# Patient Record
Sex: Male | Born: 1955 | Race: White | Hispanic: No | Smoking: Never smoker
Health system: Southern US, Community
[De-identification: ages and names within clinical notes are randomized; demographics above are authoritative.]

## PROBLEM LIST (undated history)

## (undated) DIAGNOSIS — E785 Hyperlipidemia, unspecified: Secondary | ICD-10-CM

## (undated) DIAGNOSIS — I10 Essential (primary) hypertension: Secondary | ICD-10-CM

## (undated) DIAGNOSIS — N200 Calculus of kidney: Secondary | ICD-10-CM

## (undated) DIAGNOSIS — N201 Calculus of ureter: Secondary | ICD-10-CM

## (undated) DIAGNOSIS — G4730 Sleep apnea, unspecified: Secondary | ICD-10-CM

## (undated) DIAGNOSIS — E119 Type 2 diabetes mellitus without complications: Secondary | ICD-10-CM

## (undated) DIAGNOSIS — I480 Paroxysmal atrial fibrillation: Secondary | ICD-10-CM

## (undated) DIAGNOSIS — M1990 Unspecified osteoarthritis, unspecified site: Secondary | ICD-10-CM

## (undated) DIAGNOSIS — T8859XA Other complications of anesthesia, initial encounter: Secondary | ICD-10-CM

## (undated) DIAGNOSIS — Z87442 Personal history of urinary calculi: Secondary | ICD-10-CM

## (undated) DIAGNOSIS — R112 Nausea with vomiting, unspecified: Secondary | ICD-10-CM

## (undated) DIAGNOSIS — Z7982 Long term (current) use of aspirin: Secondary | ICD-10-CM

## (undated) DIAGNOSIS — I5189 Other ill-defined heart diseases: Secondary | ICD-10-CM

## (undated) DIAGNOSIS — R079 Chest pain, unspecified: Secondary | ICD-10-CM

## (undated) DIAGNOSIS — G4733 Obstructive sleep apnea (adult) (pediatric): Secondary | ICD-10-CM

## (undated) HISTORY — PX: LITHOTRIPSY: SUR834

## (undated) HISTORY — DX: Morbid (severe) obesity due to excess calories: E66.01

## (undated) HISTORY — DX: Hyperlipidemia, unspecified: E78.5

## (undated) HISTORY — DX: Chest pain, unspecified: R07.9

## (undated) HISTORY — PX: COLONOSCOPY: SHX174

## (undated) HISTORY — DX: Other ill-defined heart diseases: I51.89

## (undated) HISTORY — DX: Type 2 diabetes mellitus without complications: E11.9

## (undated) HISTORY — DX: Calculus of kidney: N20.0

## (undated) HISTORY — DX: Paroxysmal atrial fibrillation: I48.0

---

## 2004-03-07 ENCOUNTER — Ambulatory Visit: Payer: Self-pay | Admitting: Urology

## 2004-03-24 ENCOUNTER — Ambulatory Visit: Payer: Self-pay | Admitting: Urology

## 2004-03-26 ENCOUNTER — Emergency Department: Payer: Self-pay | Admitting: Emergency Medicine

## 2004-10-28 ENCOUNTER — Ambulatory Visit: Payer: Self-pay

## 2006-09-13 ENCOUNTER — Emergency Department: Payer: Self-pay | Admitting: Emergency Medicine

## 2006-09-13 ENCOUNTER — Other Ambulatory Visit: Payer: Self-pay

## 2009-09-30 ENCOUNTER — Ambulatory Visit: Payer: Self-pay | Admitting: Urology

## 2011-02-10 ENCOUNTER — Ambulatory Visit: Payer: Self-pay | Admitting: Family Medicine

## 2011-04-25 DIAGNOSIS — N529 Male erectile dysfunction, unspecified: Secondary | ICD-10-CM

## 2011-04-25 DIAGNOSIS — E291 Testicular hypofunction: Secondary | ICD-10-CM

## 2011-04-25 HISTORY — DX: Testicular hypofunction: E29.1

## 2011-04-25 HISTORY — DX: Male erectile dysfunction, unspecified: N52.9

## 2011-08-16 ENCOUNTER — Ambulatory Visit: Payer: Self-pay | Admitting: Urology

## 2011-12-10 ENCOUNTER — Ambulatory Visit: Payer: Self-pay | Admitting: Medical

## 2012-01-24 ENCOUNTER — Ambulatory Visit: Payer: Self-pay | Admitting: Urology

## 2012-01-24 DIAGNOSIS — E291 Testicular hypofunction: Secondary | ICD-10-CM | POA: Insufficient documentation

## 2012-01-24 DIAGNOSIS — N529 Male erectile dysfunction, unspecified: Secondary | ICD-10-CM | POA: Insufficient documentation

## 2012-01-24 DIAGNOSIS — N201 Calculus of ureter: Secondary | ICD-10-CM | POA: Insufficient documentation

## 2012-01-24 DIAGNOSIS — N23 Unspecified renal colic: Secondary | ICD-10-CM | POA: Insufficient documentation

## 2012-01-24 DIAGNOSIS — N200 Calculus of kidney: Secondary | ICD-10-CM | POA: Insufficient documentation

## 2012-04-10 DIAGNOSIS — R8290 Unspecified abnormal findings in urine: Secondary | ICD-10-CM | POA: Insufficient documentation

## 2012-07-10 ENCOUNTER — Ambulatory Visit: Payer: Self-pay | Admitting: Urology

## 2012-09-05 ENCOUNTER — Ambulatory Visit: Payer: Self-pay | Admitting: Unknown Physician Specialty

## 2013-02-18 ENCOUNTER — Ambulatory Visit: Payer: Self-pay | Admitting: Urology

## 2013-02-21 DIAGNOSIS — R310 Gross hematuria: Secondary | ICD-10-CM | POA: Insufficient documentation

## 2013-02-25 ENCOUNTER — Ambulatory Visit: Payer: Self-pay | Admitting: Urology

## 2013-06-06 ENCOUNTER — Ambulatory Visit: Payer: Self-pay | Admitting: Family Medicine

## 2013-06-16 ENCOUNTER — Ambulatory Visit: Payer: Self-pay

## 2013-08-22 DIAGNOSIS — I480 Paroxysmal atrial fibrillation: Secondary | ICD-10-CM

## 2013-08-22 HISTORY — DX: Paroxysmal atrial fibrillation: I48.0

## 2013-09-05 ENCOUNTER — Observation Stay: Payer: Self-pay | Admitting: Family Medicine

## 2013-09-05 DIAGNOSIS — I4891 Unspecified atrial fibrillation: Secondary | ICD-10-CM

## 2013-09-05 DIAGNOSIS — R072 Precordial pain: Secondary | ICD-10-CM

## 2013-09-05 LAB — COMPREHENSIVE METABOLIC PANEL
ALK PHOS: 74 U/L
ANION GAP: 1 — AB (ref 7–16)
AST: 19 U/L (ref 15–37)
Albumin: 3.7 g/dL (ref 3.4–5.0)
BUN: 12 mg/dL (ref 7–18)
Bilirubin,Total: 0.7 mg/dL (ref 0.2–1.0)
CALCIUM: 8.7 mg/dL (ref 8.5–10.1)
CO2: 31 mmol/L (ref 21–32)
CREATININE: 0.67 mg/dL (ref 0.60–1.30)
Chloride: 108 mmol/L — ABNORMAL HIGH (ref 98–107)
EGFR (Non-African Amer.): >60
GLUCOSE: 103 mg/dL — AB (ref 65–99)
OSMOLALITY: 279 (ref 275–301)
POTASSIUM: 3.9 mmol/L (ref 3.5–5.1)
SGPT (ALT): 27 U/L (ref 12–78)
Sodium: 140 mmol/L (ref 136–145)
Total Protein: 7.3 g/dL (ref 6.4–8.2)

## 2013-09-05 LAB — CBC
HCT: 43.7 % (ref 40.0–52.0)
HGB: 15.1 g/dL (ref 13.0–18.0)
MCH: 32 pg (ref 26.0–34.0)
MCHC: 34.6 g/dL (ref 32.0–36.0)
MCV: 93 fL (ref 80–100)
Platelet: 209 10*3/uL (ref 150–440)
RBC: 4.72 10*6/uL (ref 4.40–5.90)
RDW: 13.2 % (ref 11.5–14.5)
WBC: 6.7 10*3/uL (ref 3.8–10.6)

## 2013-09-05 LAB — TROPONIN I: Troponin-I: <0.02 ng/mL

## 2013-09-05 LAB — CK TOTAL AND CKMB (NOT AT ARMC)
CK, TOTAL: 95 U/L
CK-MB: 1.1 ng/mL (ref 0.5–3.6)

## 2013-09-05 LAB — MAGNESIUM: MAGNESIUM: 2 mg/dL

## 2013-09-06 DIAGNOSIS — I517 Cardiomegaly: Secondary | ICD-10-CM

## 2013-09-06 LAB — BASIC METABOLIC PANEL
Anion Gap: 4 — ABNORMAL LOW (ref 7–16)
BUN: 15 mg/dL (ref 7–18)
CALCIUM: 8.7 mg/dL (ref 8.5–10.1)
Chloride: 109 mmol/L — ABNORMAL HIGH (ref 98–107)
Co2: 29 mmol/L (ref 21–32)
Creatinine: 0.62 mg/dL (ref 0.60–1.30)
EGFR (African American): >60
GLUCOSE: 108 mg/dL — AB (ref 65–99)
Osmolality: 284 (ref 275–301)
Potassium: 4.3 mmol/L (ref 3.5–5.1)
Sodium: 142 mmol/L (ref 136–145)

## 2013-09-06 LAB — CBC WITH DIFFERENTIAL/PLATELET
BASOS ABS: 0 10*3/uL (ref 0.0–0.1)
Basophil %: 0.5 %
EOS ABS: 0.1 10*3/uL (ref 0.0–0.7)
Eosinophil %: 1.8 %
HCT: 42.6 % (ref 40.0–52.0)
HGB: 14.8 g/dL (ref 13.0–18.0)
LYMPHS ABS: 1.7 10*3/uL (ref 1.0–3.6)
LYMPHS PCT: 24.2 %
MCH: 32.1 pg (ref 26.0–34.0)
MCHC: 34.7 g/dL (ref 32.0–36.0)
MCV: 93 fL (ref 80–100)
MONOS PCT: 7.9 %
Monocyte #: 0.6 x10 3/mm (ref 0.2–1.0)
NEUTROS PCT: 65.6 %
Neutrophil #: 4.7 10*3/uL (ref 1.4–6.5)
Platelet: 210 10*3/uL (ref 150–440)
RBC: 4.59 10*6/uL (ref 4.40–5.90)
RDW: 13.2 % (ref 11.5–14.5)
WBC: 7.2 10*3/uL (ref 3.8–10.6)

## 2013-09-06 LAB — LIPID PANEL
Cholesterol: 175 mg/dL (ref 0–200)
HDL Cholesterol: 29 mg/dL — ABNORMAL LOW (ref 40–60)
Ldl Cholesterol, Calc: 117 mg/dL — ABNORMAL HIGH (ref 0–100)
TRIGLYCERIDES: 145 mg/dL (ref 0–200)
VLDL CHOLESTEROL, CALC: 29 mg/dL (ref 5–40)

## 2013-09-06 LAB — TSH: Thyroid Stimulating Horm: 1.19 u[IU]/mL

## 2013-09-06 LAB — HEMOGLOBIN A1C: HEMOGLOBIN A1C: 5.3 % (ref 4.2–6.3)

## 2013-09-10 ENCOUNTER — Telehealth: Payer: Self-pay

## 2013-09-10 NOTE — Telephone Encounter (Signed)
Attempted to contact pt regarding discharge from [facility] on 09/06/13. Left message for pt to call back.

## 2013-09-11 NOTE — Telephone Encounter (Signed)
Patient contacted regarding discharge from [facility] on 09/06/13.  Patient understands to follow up with Dr. Mariah MillingGollan on 09/26/13 at 3:30 at [facility]. Patient understands discharge instructions? yes Patient understands medications and regiment? yes Patient understands to bring all medications to this visit? yes  Pt states that he has not picked up his metoprolol, as the prescription did not specify tartrate or succinate. Pt states that he does not want to take the metoprolol at all, as his friends told him to "stay away from it".  Pt adamantly refuses to take any new meds at this time, as he is afraid of the side effects.

## 2013-09-26 ENCOUNTER — Encounter: Payer: BC Managed Care – PPO | Admitting: Cardiovascular Disease

## 2013-09-26 ENCOUNTER — Encounter: Payer: Self-pay | Admitting: Cardiovascular Disease

## 2013-09-26 ENCOUNTER — Ambulatory Visit (INDEPENDENT_AMBULATORY_CARE_PROVIDER_SITE_OTHER): Payer: BC Managed Care – PPO | Admitting: Cardiovascular Disease

## 2013-09-26 ENCOUNTER — Encounter (INDEPENDENT_AMBULATORY_CARE_PROVIDER_SITE_OTHER): Payer: Self-pay

## 2013-09-26 VITALS — BP 130/80 | HR 62 | Ht 72.0 in | Wt 233.0 lb

## 2013-09-26 DIAGNOSIS — E785 Hyperlipidemia, unspecified: Secondary | ICD-10-CM

## 2013-09-26 DIAGNOSIS — E669 Obesity, unspecified: Secondary | ICD-10-CM

## 2013-09-26 DIAGNOSIS — R079 Chest pain, unspecified: Secondary | ICD-10-CM

## 2013-09-26 DIAGNOSIS — I4891 Unspecified atrial fibrillation: Secondary | ICD-10-CM

## 2013-09-26 DIAGNOSIS — G4733 Obstructive sleep apnea (adult) (pediatric): Secondary | ICD-10-CM

## 2013-09-26 MED ORDER — METOPROLOL TARTRATE 25 MG PO TABS: 25.0000 mg | ORAL_TABLET | Freq: Two times a day (BID) | ORAL | Status: DC | PRN

## 2013-09-26 NOTE — Assessment & Plan Note (Signed)
Recent chest pain in the setting of atrial fibrillation. Stress test showed no ischemia. Normal ejection fraction

## 2013-09-26 NOTE — Assessment & Plan Note (Signed)
We have encouraged continued exercise, careful diet management in an effort to lose weight. 

## 2013-09-26 NOTE — Assessment & Plan Note (Signed)
Encouraged him to stay on his Zocor. Encouraged weight loss

## 2013-09-26 NOTE — Progress Notes (Signed)
   Patient ID: Marcus Bryant, male    DOB: 1955-11-08, 58 y.o.   MRN: [id number]  HPI Comments: Marcus Bryant is a pleasant 58 year old gentleman with history of snoring, obstructive sleep apnea, positive sleep study per the patient and 2006 who does not wear CPAP, kidney stones, hyperlipidemia, abnormal stress test in 2008 who presented to the hospital 09/05/2013 with chest pain, atrial fibrillation. He converted to normal sinus rhythm en route, stress test showed no ischemia, echocardiogram showed normal ejection fraction with dilated left atrium He presents today for followup after recent discharge on 09/06/2013 Initial symptoms from his atrial fibrillation were described as a tightness in his chest. Initially presented to [facility] urgent care before being transferred by EMS to the emergency room  He was discharged on metoprolol but after reading the side effects, decided not to take the medication. He continues on simvastatin, aspirin, fish oil Since discharge he has had no symptoms of tachycardia or palpitations. He feels that his initial episode of atrial fibrillation was secondary to significant gastric discomfort after overeating including eating a bloomin onion. He tends to overeat at the buffet  Echocardiogram 09/06/2013 showed normal ejection fraction, normal right ventricular systolic pressure, moderately dilated left atrium  Stress test dated 09/06/2013 showed no ischemia, normal study, ejection fraction 70%  EKG today shows normal sinus rhythm with no significant ST or T wave changes, rate 62 beats per minute   Outpatient Encounter Prescriptions as of 09/26/2013  Medication Sig  . aspirin 81 MG tablet Take 81 mg by mouth daily.  . Omega-3 Fatty Acids (FISH OIL) 1000 MG CAPS Take 1,000 mg by mouth daily.  . simvastatin (ZOCOR) 40 MG tablet Take 40 mg by mouth daily.     Review of Systems  Constitutional: Negative.   HENT: Negative.   Eyes: Negative.   Respiratory: Negative.    Cardiovascular: Negative.   Gastrointestinal: Negative.   Endocrine: Negative.   Musculoskeletal: Negative.   Skin: Negative.   Allergic/Immunologic: Negative.   Neurological: Negative.   Hematological: Negative.   Psychiatric/Behavioral: Negative.   All other systems reviewed and are negative.   BP 130/80  Pulse 62  Ht 6' (1.829 m)  Wt 233 lb (105.688 kg)  BMI 31.59 kg/m2  Physical Exam  Nursing note and vitals reviewed. Constitutional: He is oriented to person, place, and time. He appears well-developed and well-nourished.  HENT:  Head: Normocephalic.  Nose: Nose normal.  Mouth/Throat: Oropharynx is clear and moist.  Eyes: Conjunctivae are normal. Pupils are equal, round, and reactive to light.  Neck: Normal range of motion. Neck supple. No JVD present.  Cardiovascular: Normal rate, regular rhythm, S1 normal, S2 normal, normal heart sounds and intact distal pulses.  Exam reveals no gallop and no friction rub.   No murmur heard. Pulmonary/Chest: Effort normal and breath sounds normal. No respiratory distress. He has no wheezes. He has no rales. He exhibits no tenderness.  Abdominal: Soft. Bowel sounds are normal. He exhibits no distension. There is no tenderness.  Musculoskeletal: Normal range of motion. He exhibits no edema and no tenderness.  Lymphadenopathy:    He has no cervical adenopathy.  Neurological: He is alert and oriented to person, place, and time. Coordination normal.  Skin: Skin is warm and dry. No rash noted. No erythema.  Psychiatric: He has a normal mood and affect. His behavior is normal. Judgment and thought content normal.      Assessment and Plan

## 2013-09-26 NOTE — Assessment & Plan Note (Signed)
History of obstructive sleep apnea, sleeps on his stomach, did not tolerate CPAP. This would be a risk factor for atrial fibrillation

## 2013-09-26 NOTE — Patient Instructions (Addendum)
You are doing well. No medication changes were made.  If you have additional episodes of atrial fibrillation, Take a metoprolol 1 to 2 pills  Please call us if you have new issues that need to be addressed before your next appt.  Your physician wants you to follow-up in: 6 months.  You will receive a reminder letter in the mail two months in advance. If you don't receive a letter, please call our office to schedule the follow-up appointment.

## 2013-09-26 NOTE — Assessment & Plan Note (Signed)
We spent most of his visit today talking about his recent episode of atrial fibrillation. Likely secondary to severe GI distress after overeating. Heart rate is borderline low. 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 We will hold off on starting routine beta blocker. We have given him metoprolol to take for any tachycardia or irregular rhythm. We discussed various ways for him to monitor his pulse rate at home including also oximeter, manual palpation and blood pressure cuff.

## 2013-10-01 ENCOUNTER — Encounter: Payer: Self-pay | Admitting: *Deleted

## 2013-10-01 ENCOUNTER — Telehealth: Payer: Self-pay

## 2013-10-01 NOTE — Telephone Encounter (Signed)
Letter faxed to number requested by patient  Patient informed

## 2013-10-01 NOTE — Telephone Encounter (Signed)
Pt called and states he needs a note for work when he was here on 09/26/2013. Pt states he needs this today. Please call. States call the work # and hit 2 and have him pages.

## 2013-10-22 ENCOUNTER — Telehealth: Payer: Self-pay | Admitting: Cardiovascular Disease

## 2013-10-22 NOTE — Telephone Encounter (Signed)
Pt got pulse meter per nurse and doctor,  But doesn't know what numbers mean.

## 2013-10-22 NOTE — Telephone Encounter (Signed)
Calling back told pt we would call back to them

## 2013-10-22 NOTE — Telephone Encounter (Signed)
Spoke w/ pt.  Advised him of how to use and read pulse/ox. He will call w/ further questions or concerns.

## 2013-10-22 NOTE — Telephone Encounter (Signed)
Left message for pt call back:  

## 2014-04-24 DIAGNOSIS — N138 Other obstructive and reflux uropathy: Secondary | ICD-10-CM

## 2014-04-24 HISTORY — DX: Benign prostatic hyperplasia with lower urinary tract symptoms: N13.8

## 2014-04-30 DIAGNOSIS — N138 Other obstructive and reflux uropathy: Secondary | ICD-10-CM | POA: Insufficient documentation

## 2014-04-30 DIAGNOSIS — N401 Enlarged prostate with lower urinary tract symptoms: Secondary | ICD-10-CM

## 2014-08-14 NOTE — Op Note (Signed)
PATIENT NAME:  Marcus Bryant, Marcus Bryant MR#:  [id number] DATE OF BIRTH:  02-23-1956  DATE OF PROCEDURE:  02/25/2013  PREOPERATIVE DIAGNOSIS: Bilateral ureterolithiasis, hydronephrosis, renal colic.   POSTOPERATIVE DIAGNOSES:  Bilateral ureterolithiasis, hydronephrosis, renal colic.   PROCEDURE: Bilateral ureteroscopy with holmium laser lithotripsy, double-J ureteral stent placement.   SURGEON: Edrick Oh, M.D.   ANESTHESIA: General endotracheal anesthesia.   INDICATIONS: The patient is a 59 year old gentleman with a long history of nephrolithiasis. He presented late last week with significant flank pain and discomfort. He had passed several stones spontaneously. A KUB was obtained, demonstrating a possible distal right ureteral calculus and possible mid left ureteral calculus. A subsequent CT scan was obtained, confirming presence of a 1.3 cm mid left ureteral calculus and a 6 to 8 mm distal right ureteral calculus with moderate obstruction. He had no evidence of renal insufficiency based on the stone passage. He presents for bilateral ureteroscopy for stone removal.   DESCRIPTION OF PROCEDURE: After informed consent was obtained, the patient was taken to the Operating Room and placed in the dorsal lithotomy position under general endotracheal anesthesia. The patient was then prepped and draped in the usual standard fashion. The 22-French rigid cystoscope was introduced into the urethra under direct vision with no urethral abnormalities noted. Upon entering the prostate fossa, moderate bilobar hypertrophy was noted with partial visual obstruction. Upon entering the bladder, the mucosa was inspected in its entirety with no gross mucosal lesions noted. Bilateral ureteral orifices were well visualized with no lesions noted. A flexible-tip Glidewire was introduced into the right ureteral orifice. Just inside of the orifice resistance was met. Multiple attempts were made at passing the guidewire under fluoroscopic  guidance without success.   The cystoscope was removed. The 6-French rigid ureteroscope was advanced into the urinary bladder. The guidewire was utilized to facilitate passage of the scope into the right ureteral orifice. The scope was advanced to the level of the stone. Significant inflammation and edema was present. With direct visualization and manipulation, the guidewire was able to be successfully passed into the upper pole collecting system. The holmium laser fiber was then utilized to fragment the stone. Visualization was limited due to the degree of the edema. The stone visualization was limited at multiple points through the fragmentation. With continued attempts and fragmentation of the edge of the stone, the stone was able to be subsequently pushed into a more dilated portion of the more proximal ureter. This facilitated better fragmentation of the remaining stone fragments. Once the stone was adequately fragmented, a basket was utilized to remove the bulk of the fragments. Multiple passes were required for removal. Once the right stone was addressed, the scope was advanced to the more proximal ureter with no additional stones noted. Prominent dilation was present. No other abnormalities were noted. Revisualization of the site of stone impaction demonstrated extensive edema. The decision was made to place a stent. The ureteroscope was removed. The 22-French rigid cystoscope was back-loaded over the guidewire and advanced into the urinary bladder. A 6-French x 26 cm double-J ureteral stent was advanced over the guidewire into the upper pole collecting system without difficulty. Adequate curl was noted within the renal pelvis. Adequate curl was also noted within the urinary bladder.   Attention was then turned to the left stone. The guidewire was advanced into the left ureteral orifice in the region of the mid pelvis at the level of the crossing vessels. Obstruction was once again met. The guidewire was  unable to be  passed beyond this level. The cystoscope was removed. The 6-French rigid ureteroscope was advanced into the urinary bladder. Utilizing the guidewire, it was advanced into the left ureter. It was advanced to the level of the mid ureter near the level of the crossing vessels. Extensive edema was encountered. The stone was barely visible through the edema. Several attempts were made at passing the guidewire beyond the stone. The initial attempt appeared to pass outside the course of the ureter. With repositioning, the guidewire was able to be advanced into the upper pole collecting system without further difficulty. In retrospect view, the degree of edema gave the appearance of the passage through the ureter. This was not performed based on subsequent visualization. With the guidewire in place, the holmium laser fiber was then utilized to fragment the stone into multiple smaller pieces. This was a larger stone and required much more fragmentation. The stone was noted to be very hard, requiring prolonged period of time for fragmentation. The remaining bulk of the stone was passed into a more dilated portion of the ureter, making fragmentation of the remaining pieces more easily accomplished. Once the stone was adequately fragmented, a basket was utilized to remove the bulk of the stone fragments. Once again, extensive edema was present at the site of stone impaction. The more proximal ureter demonstrated no other significant abnormalities.   The ureteroscope was removed. The 22-French rigid cystoscope was back-loaded over the guidewire and advanced into the urinary bladder. A second 6-French x 26 cm double-J ureteral stent was advanced over the guidewire into the upper pole collecting system without difficulty. Adequate curl was noted within the renal pelvis. Adequate curl was also noted within the urinary bladder. The bladder was irrigated free of remaining stone fragments. These were collected and will  be sent for stone analysis. The bladder was then drained.   The cystoscope was removed. The patient was returned to the supine position and awakened from general endotracheal anesthesia. He was taken to the Recovery Room in stable condition. There were no problems or complications. The patient tolerated the procedure well.   ____________________________ Denice Bors. Jacqlyn Larsen, MD bsc:cs D: 02/25/2013 20:02:48 ET T: 02/25/2013 20:16:47 ET JOB#: [id number]  cc: Denice Bors. Jacqlyn Larsen, MD, <Dictator> Denice Bors Jamien Casanova MD ELECTRONICALLY SIGNED 02/26/2013 7:59

## 2014-08-15 NOTE — H&P (Signed)
PATIENT NAME:  Marcus Bryant, Marcus Bryant MR#:  [id number] DATE OF BIRTH:  1955-09-10  DATE OF ADMISSION:  09/05/2013  REFERRING PHYSICIAN:  Governor Rooksebecca Lord.  PRIMARY CARE PHYSICIAN:  Barry BrunnerGlenn Willett.  CHIEF COMPLAINT:   Chest pain.  HISTORY OF PRESENT ILLNESS:  A very nice 59 year old gentleman with history of hyperlipidemia, kidney stones, [facility], had a hamburger, blooming onion, fries, and after that started developing some significant chest discomfort. Apparently, at 5:00 a.m. this morning, the patient woke up, stood up, went to the bathroom and started  sweating profusely. Chest pain initiated just right before that; felt like an uncomfortable sensation on the chest, located in the middle of the chest, diffuse, all over the anterior wall of the chest, dull, pressure-like, no shortness of breath associated, but the patient felt that he was breathing a little bit faster. Whenever he checked his pulse, his heart rate was also feeling really fast. The pain did not have any radiation, lasted for 1-1/2 to 2 hours and then went away. The patient laid down on the floor and he felt a little bit colder there because he was feeling really hot from the sweating, and the pain started to improve.   He called his girlfriend. His girlfriend took him to the urgent care. The urgent care was closed, but there was a GI office there, and the receptionist noted that the patient was keeping both arms on his chest. The patient denies having chest pain at that moment right now, but it seemed like he was having significant discomfort. He was sent to the Emergency Department. In the Emergency Department, he was evaluated. EMS evaluated him first at GI office, and they found out that he was in atrial fibrillation with a heart rate of up to 152. After he arrived to the Emergency Department, an EKG here showed normal sinus rhythm with a heart rate of 64.   The patient was not aware of having atrial fibrillation in  the past. He is admitted for evaluation of chest pain. He had a stress test in 2008 that was negative.   REVIEW OF SYSTEMS:  A twelve-system review of systems was done.  CONSTITUTIONAL:  No fever, fatigue, weakness, weight loss or weight gain.  EYES:  No blurry vision or double vision.  EARS, NOSE, THROAT:  No difficulty swallowing or tinnitus.  RESPIRATORY:  No shortness of breath, wheezing, painful respirations or hemoptysis.  CARDIOVASCULAR:  Positive chest pain. No orthopnea. No edema. (no previous history of arrhythmias. No palpitations. No syncope.  GASTROINTESTINAL:  No nausea, vomiting, abdominal pain, constipation or diarrhea.  GENITOURINARY:  No dysuria or hematuria.  ENDOCRINE:  No polyuria, polydipsia or polyphagia.  HEMATOLOGIC AND LYMPHATIC:  No anemia, easy bruising or bleeding.  SKIN:  No rashes or petechiae.   MUSCULOSKELETAL:  No significant neck pain, back pain, shoulder pain or gout.  NEUROLOGIC:  No numbness, tingling, vertigo or ataxia.  PSYCHIATRIC:  No insomnia or depression.   PAST MEDICAL HISTORY: 1.  Kidney stones.  2.  Hyperlipidemia.  3.  Mildly obese.   ALLERGIES:  No known drug allergies.   PAST SURGICAL HISTORY:  Seven kidney  stone lithotripsies.   FAMILY HISTORY:  Positive for MI in his father at the age of 59. He also had significant cancer. The patient is not aware of what type of cancer his father had. His mother had a CVA at the age of 59 and also hypertension.   SOCIAL HISTORY:  The  patient has never smoked. He does not drink. He works in a [employer] as a Scientist, research (medical). He states he is active. He does a lot of paperwork, but he moves around all the time and walks, but he does not do any heavy lifting or any heavy activities. He does not exercise. He lives by himself. He is divorced.   MEDICATIONS:  The patient takes Zocor 40 mg once daily, aspirin 81 mg once daily occasional and he takes fish oil once a day and Centrum Silver.   PHYSICAL  EXAMINATION: VITAL SIGNS:  Blood pressure 121/74, pulse 62, respirations 20, temperature 97.7, oxygen saturation 97% on room air.  GENERAL: Alert and oriented x 3, in no acute distress. No respiratory distress. Hemodynamically stable.  HEENT:  Pupils are equal and reactive. Extraocular movements intact. Mucosa  moist. Anicteric sclerae.  Pink conjunctivae. There are no oral lesions. No oropharyngeal exudates.  NECK:  Supple. No JVD. No thyromegaly. No adenopathy. No carotid bruits.  CARDIOVASCULAR:  Regular rate and rhythm. No murmurs, rubs or gallops. No displacement of PMI. No tenderness to palpation of anterior chest wall.  LUNGS:  Clear without any wheezing or crepitus. No use of accessory muscles.  ABDOMEN:  Soft, nontender, nondistended. No hepatosplenomegaly. No masses. Bowel sounds are positive.  EXTREMITIES:  No edema, cyanosis or clubbing. Pulses +2. Capillary refill less than 3.  GENITALIA:  Deferred. MUSCULOSKELETAL:  No joint effusions or joint swelling.  NEUROLOGIC:  Cranial nerves II through XII intact. No focal findings.  PSYCHIATRIC:  No agitation. The patient is alert, oriented x 3.  LYMPHATIC:  Negative for lymphadenopathy in the neck or supraclavicular areas.  SKIN:  No rashes or petechiae.   Glucose 103, BUN 12, creatinine 0.67, sodium 140, potassium 3.9, chloride 108. Other electrolytes were within normal limits. LFTs are normal. Troponin first set is negative. White count is 6.7. Hemoglobin is 15. Platelet count 209.   EKG: As mentioned above, we have atrial fibrillation with RVR up to 150 by EMS. Here, it is normal sinus rhythm. No ST depression or elevation. No signs of LVH. He has poor R wave progression and small voltages on QRS.   CHEST X-RAY:  No significant abnormalities with bibasilar atelectasis.   ASSESSMENT AND PLAN: This is a 59 year old gentleman with history of hyperlipidemia, mildly obese, family history of coronary artery disease, comes with chest pain and  paroxysmal atrial fibrillation.  1.  Chest pain. This could be secondary to the atrial fibrillation event. The atrial fibrillation has now resolved. His pain is resolved. The patient is not aware of having episodes like this in the past. He is mildly obese, has hyperlipidemia. We are going to evaluate other risk factors, check lipid profile, start him on aspirin, morphine p.r.n., p.r.n. nitroglycerin and admit him for observation for chest pain.   The patient's blood pressure seems to be stable at 121/74 and his heart rate is in between 57 and 62, for which we are going to start him on a very low dose of beta blocker, metoprolol 12.5 mg only once daily and see if he can tolerate it.  This is just mostly to control the atrial fibrillation. We are going to get an echocardiogram to evaluate chambers and the possibility of dilation and blood clots and a stress test in the morning. Since the patient has paroxysmal atrial fibrillation, we are going to try to get a cardiology consultation for followup and the possibility of getting a Holter or an EP study.  2.  As far as his hyperlipidemia, continue statin. Check lipid levels.   DVT prophylaxis with Lovenox. GI prophylaxis with Protonix.   I spent about 45 minutes with this patient.   ____________________________ Felipa Furnace, MD rsg:dmm D: 09/05/2013 12:12:19 ET T: 09/05/2013 12:36:14 ET JOB#: [id number]  cc: Felipa Furnace, MD, <Dictator> Kassady Laboy Juanda Chance MD ELECTRONICALLY SIGNED 09/12/2013 22:38

## 2014-08-15 NOTE — Discharge Summary (Signed)
PATIENT NAME:  Marcus Bryant, Marcus Bryant MR#:  [id number] DATE OF BIRTH:  [birth year]  DATE OF ADMISSION:  09/05/2013 DATE OF DISCHARGE:  09/06/2013  DISCHARGE DIAGNOSES: 1. Chest pain. Stress test negative.  2. Atrial fibrillation, paroxysmal. Cardiac consult was done. Low-dose beta blocker and aspirin was suggested.  3. Hyperlipidemia.   CONDITION ON DISCHARGE: Stable.   CODE STATUS: Full code.   DISCHARGE MEDICATIONS: 1. Simvastatin 40 mg oral once a day.  2. Aspirin 81 mg once a day.  3. Fish oil 1 capsule once a day.  4. Metoprolol 12.5 mg oral once a day.   DIET ON DISCHARGE: Low-sodium, low-fat, low-cholesterol diet, regular  consistency.   ACTIVITY: As tolerated.   TIMEFRAME TO FOLLOW-UP: Within 1 to 2 weeks with Dr. Windell HummingbirdGollan's office.   HISTORY OF PRESENT ILLNESS: This is a 59 year old male with history of hyperlipidemia, kidney stones, had significant chest discomfort around 5:00 a.m. in the morning. He woke up, stood up, went to the bathroom and was sweating profusely with chest pain, was dull, pressure-like. His pulse was fast with that and no radiation of the pain. The pain lasted for 1-1/2 to two hours. Finally decided to go to GI office, went over there and  from there sent the patient to Emergency Room as the heart rate was 152. EKG in the hospital was showing heart rate was up to 64 while it was 152 with EMS. Admitted to hospital for the management of atrial fibrillation and rapid ventricular response.  HOSPITAL COURSE AND STAY:  The patient did not have any chest pain in the hospital, and he remained in normal sinus rhythm. Cardiology consult was called in and they did a stress test, which was negative. TSH was normal, so spoke to Dr. Mariah MillingGollan and he suggested low dose metoprolol plus aspirin. The patient had hyperlipidemia and the patient was taking statin, so we continued that after the discharge.   CONSULT IN THE HOSPITAL: Dr. Mariah MillingGollan for cardiology.   IMPORTANT LABORATORY  RESULTS IN THE HOSPITAL: Chest x-ray, portable on admission cardiomegaly, and bibasilar atelectasis without acute cardiopulmonary disease on this hypoventilated AP portable examination.   Glucose 103, BUN 12, creatinine 0.67, sodium is 140, potassium is 3.9, chloride 108, CO2 is 31 and calcium is 8.7. WBC 6.7, hemoglobin is 15.1 and platelet count is 209. Troponin less than 0.02, cholesterol 175, triglyceride is 145, HDL is 29 and LDL 117.   Echocardiogram was done which showed ejection fraction of 65%, normal global left ventricular systolic function  and moderately dilated left atrium.   TSH was 1.19.   Stress test is done which showed no significant wall motion abnormality.    myocardial perfusion study with no significant ischemia, and normal study.   TOTAL TIME SPENT ON THIS DISCHARGE: 35 minutes.    ____________________________ Hope PigeonVaibhavkumar G. Elisabeth PigeonVachhani, MD vgv:sg D: 09/07/2013 08:03:19 ET T: 09/07/2013 10:55:15 ET JOB#: [id number]  cc: Hope PigeonVaibhavkumar G. Elisabeth PigeonVachhani, MD, <Dictator> Antonieta Ibaimothy J. Gollan, MD  Altamese DillingVAIBHAVKUMAR Charlen Bakula MD ELECTRONICALLY SIGNED 09/16/2013 16:31

## 2014-08-15 NOTE — Consult Note (Signed)
General Aspect PCP: Dr. Ilene Qua - Lupita Leash (just retired) Cards: New - seen by Dr. Jerilynn Mages. Fletcher Anon  59 y/o male w/o prior cardiac hx who presented today 2/2 rapid afib.   Present Illness 59 y/o male with h/o HL and nephrolithiasis.  He has no prior cardiac hx but says that he had a stress test in ~ 2008, which was nl.  Yesterday, he passed two kidney stones.  He did not have significant pain surrounding this.  Last night he ate out at the [facility] and afterwards had some indigestion.  He went to bed around 1 AM and awoke around 5 AM to void.  Afterwards, he took his dog out and while standing on his porch he became diaphoretic and developed moderate sscp w/ palpitations.  He checked his pulse and noted his HR to be up.  He went back to bed and an hour later he got up again and felt somewhat better but continued to note mild chest discomfort.  He called his girlfriend and they drove to a local urgent care.  It didn't open until 8a but an office next door was open and someone in there called EMS for him.  EMS found him to be in afib with a rate of 152.  He was taken to [facility] and en route, his rate began to slow (strips in chart).  Upon arrival here, he was in sinus @ 64 bpm.  Labs have been unrevealing.  Trop neg x 2.  C/P resolved prior to arrival in ED and has not recurred.  He is maintaining sinus on the monitor.   Physical Exam:  GEN pleasant, nad   HEENT pink conjunctivae, moist oral mucosa   NECK supple  No masses  no bruits/jvd.   RESP normal resp effort  clear BS   CARD Regular rate and rhythm  Normal, S1, S2  No murmur   ABD denies tenderness  soft  normal BS   LYMPH negative neck   EXTR negative cyanosis/clubbing, negative edema   SKIN normal to palpation   NEURO grossly intact, nonfocal.   PSYCH alert, A+O to time, place, person   Review of Systems:  General: No Complaints   Skin: No Complaints   ENT: No Complaints   Eyes: No Complaints   Neck: No Complaints   Respiratory: No  Complaints   Cardiovascular: Chest pain or discomfort  Palpitations   Gastrointestinal: indigestion last night.   Genitourinary: nephrolithiasis - 7 procedures, last ~ 6 mos ago.  Passed two stones yesterday.   Vascular: No Complaints   Musculoskeletal: No Complaints   Neurologic: No Complaints   Hematologic: No Complaints   Endocrine: No Complaints   Psychiatric: No Complaints   Review of Systems: All other systems were reviewed and found to be negative   Medications/Allergies Reviewed Medications/Allergies reviewed   Family & Social History:  Family and Social History:  Family History Father had an MI @ 68. Also had CA.  Mother had HTN and CVA @ 46.   Social History negative tobacco, negative ETOH, negative Illicit drugs   Place of Living Home  Lives in [place] by himself.  GF nearby.     Kidney Stones:    Hypercholesterolemia:   Home Medications: Medication Instructions Status  simvastatin 40 mg oral tablet 1 tab(s) orally once a day (at bedtime) Active  aspirin 81 mg oral tablet 1 tab(s) orally once a day Active  Fish Oil - oral capsule 1 cap(s) orally once a day Active  Centrum Men's Therapeutic Multiple Vitamins with Minerals oral tablet 1 tab(s) orally once a day Active   Lab Results:  Hepatic:  15-May-15 08:39   Bilirubin, Total 0.7  Alkaline Phosphatase 74 (45-117 NOTE: New Reference Range 03/14/13)  SGPT (ALT) 27  SGOT (AST) 19  Total Protein, Serum 7.3  Albumin, Serum 3.7  Routine Chem:  15-May-15 08:39   Magnesium, Serum 2.0 (1.8-2.4 THERAPEUTIC RANGE: 4-7 mg/dL TOXIC: > 10 mg/dL  -----------------------)  Glucose, Serum  103  BUN 12  Creatinine (comp) 0.67  Sodium, Serum 140  Potassium, Serum 3.9  Chloride, Serum  108  CO2, Serum 31  Calcium (Total), Serum 8.7  Osmolality (calc) 279  eGFR (African American) >60  eGFR (Non-African American) >60 (eGFR values <48m/min/1.73 m2 may be an indication of chronic kidney disease  (CKD). Calculated eGFR is useful in patients with stable renal function. The eGFR calculation will not be reliable in acutely ill patients when serum creatinine is changing rapidly. It is not useful in  patients on dialysis. The eGFR calculation may not be applicable to patients at the low and high extremes of body sizes, pregnant women, and vegetarians.)  Anion Gap  1  Cardiac:  15-May-15 08:39   Troponin I < 0.02 (0.00-0.05 0.05 ng/mL or less: NEGATIVE  Repeat testing in 3-6 hrs  if clinically indicated. >0.05 ng/mL: POTENTIAL  MYOCARDIAL INJURY. Repeat  testing in 3-6 hrs if  clinically indicated. NOTE: An increase or decrease  of 30% or more on serial  testing suggests a  clinically important change)  CK, Total 95 (39-308 NOTE: NEW REFERENCE RANGE  05/26/2013)  CPK-MB, Serum 1.1 (Result(s) reported on 05 Sep 2013 at 09:05AM.)    13:00   Troponin I < 0.02 (0.00-0.05 0.05 ng/mL or less: NEGATIVE  Repeat testing in 3-6 hrs  if clinically indicated. >0.05 ng/mL: POTENTIAL  MYOCARDIAL INJURY. Repeat  testing in 3-6 hrs if  clinically indicated. NOTE: An increase or decrease  of 30% or more on serial  testing suggests a  clinically important change)  Routine Hem:  15-May-15 08:39   WBC (CBC) 6.7  RBC (CBC) 4.72  Hemoglobin (CBC) 15.1  Hematocrit (CBC) 43.7  Platelet Count (CBC) 209 (Result(s) reported on 05 Sep 2013 at 08:54AM.)  MCV 93  MCH 32.0  MCHC 34.6  RDW 13.2   EKG:  EKG Interp. by me   Interpretation ems - afib, 116, twi III. ER: rsr, 64, twi III.   Radiology Results: XRay:    15-May-15 08:30, Chest Portable Single View  Chest Portable Single View   REASON FOR EXAM:    Chest pain  COMMENTS:       PROCEDURE: DXR - DXR PORTABLE CHEST SINGLE VIEW  - Sep 05 2013  8:30AM     CLINICAL DATA:  Chest pain    EXAM:  PORTABLE CHEST - 1 VIEW    COMPARISON:  DG CHEST 1V PORT dated 09/13/2006    FINDINGS:  Examination is degraded due to patient body  habitus and portable  technique.  Grossly unchanged borderline enlarged cardiac silhouette and  mediastinal contours, possibly accentuated due to decreased lung  volumes and AP projection. Unchanged minimal bibasilar heterogeneous  opacities favored to represent atelectasis. No pleural effusion or  pneumothorax. No evidence of edema. Unchanged bones.     IMPRESSION:  Cardiomegaly and bibasilar atelectasis without acute cardiopulmonary  disease on this hypoventilated AP portable examination.      Electronically Signed    By: JJenny Reichmann  Watts M.D.    On: 09/05/2013 08:33     Verified By: Aileen Fass, M.D.,    No Known Allergies:   Vital Signs/Nurse's Notes: **Vital Signs.:   15-May-15 14:20  Vital Signs Type Admission  Temperature Temperature (F) 98  Celsius 36.6  Temperature Source oral  Pulse Pulse 68  Respirations Respirations 20  Systolic BP Systolic BP 550  Diastolic BP (mmHg) Diastolic BP (mmHg) 75  Mean BP 88  Pulse Ox % Pulse Ox % 96  Oxygen Delivery Room Air/ 21 %    Impression 1.  PAF w/ RVR:  Likely started this AM @ 5AM and broke en route to ER w/o intervention.  Now maintaining sinus.  Agree with low-dose bb/echo.  Lytes ok.  TSH pending.  CHA2DS2VASc = 0.  Would not plan on long term oral anticoagulation @ this time.   2.  Midsternal chest pain:  in setting of #1.  Trop neg.  Agree with myoview to r/o ischemia.  Could be done as outpt if inpt test not feasible over weekend.  3.  HL:  on statin.   Electronic Signatures for Addendum Section:  Kathlyn Sacramento (MD) (Signed Addendum [id number] 17:50)  The patient was seen and examined. Agree with the above. He presented with palpitations and chest pain. Was in A-fib but converted to NSR. He feels fine. ECG is normal.  Agree with echo and stress test.  Consider Diltiazem ER 120 mg once daily. No indication for anticoagulation.   Electronic Signatures: Kathlyn Sacramento (MD)  (Signed 15-May-15 17:50)  Co-Signer:  General Aspect/Present Illness, History and Physical Exam, Review of System, Family & Social History, Past Medical History, Home Medications, Labs, EKG , Radiology, Allergies, Vital Signs/Nurse's Notes, Impression/Plan Rogelia Mire (NP)  (Signed 15-May-15 17:08)  Authored: General Aspect/Present Illness, History and Physical Exam, Review of System, Family & Social History, Past Medical History, Home Medications, Labs, EKG , Radiology, Allergies, Vital Signs/Nurse's Notes, Impression/Plan   Last Updated: 15-May-15 17:50 by Kathlyn Sacramento (MD)

## 2014-09-29 ENCOUNTER — Ambulatory Visit: Payer: Worker's Compensation

## 2014-09-29 ENCOUNTER — Ambulatory Visit
Admission: EM | Admit: 2014-09-29 | Discharge: 2014-09-29 | Disposition: A | Discharge status: Home / Self Care | Payer: Self-pay | Attending: Internal Medicine | Admitting: Internal Medicine

## 2014-09-29 DIAGNOSIS — I480 Paroxysmal atrial fibrillation: Secondary | ICD-10-CM | POA: Insufficient documentation

## 2014-09-29 DIAGNOSIS — M7042 Prepatellar bursitis, left knee: Secondary | ICD-10-CM | POA: Insufficient documentation

## 2014-09-29 DIAGNOSIS — Z79899 Other long term (current) drug therapy: Secondary | ICD-10-CM | POA: Insufficient documentation

## 2014-09-29 DIAGNOSIS — E785 Hyperlipidemia, unspecified: Secondary | ICD-10-CM | POA: Insufficient documentation

## 2014-09-29 DIAGNOSIS — Z7982 Long term (current) use of aspirin: Secondary | ICD-10-CM | POA: Insufficient documentation

## 2014-09-29 NOTE — ED Notes (Signed)
Left knee. Pt injured his knee about 3 months ago while working at [employer]. Pt reports he tripped and fell on uneven cement. Pt's main complaint today is knee swelling. Pt reports there is knee pain when he bends over (10/10).

## 2014-09-29 NOTE — Discharge Instructions (Signed)
Bursitis °Bursitis is a swelling and soreness (inflammation) of a fluid-filled sac (bursa) that overlies and protects a joint. It can be caused by injury, overuse of the joint, arthritis or infection. The joints most likely to be affected are the elbows, shoulders, hips and knees. °HOME CARE INSTRUCTIONS  °· Apply ice to the affected area for 15-20 minutes each hour while awake for 2 days. Put the ice in a plastic bag and place a towel between the bag of ice and your skin. °· Rest the injured joint as much as possible, but continue to put the joint through a full range of motion, 4 times per day. (The shoulder joint especially becomes rapidly "frozen" if not used.) When the pain lessens, begin normal slow movements and usual activities. °· Only take over-the-counter or prescription medicines for pain, discomfort or fever as directed by your caregiver. °· Your caregiver may recommend draining the bursa and injecting medicine into the bursa. This may help the healing process. °· Follow all instructions for follow-up with your caregiver. This includes any orthopedic referrals, physical therapy and rehabilitation. Any delay in obtaining necessary care could result in a delay or failure of the bursitis to heal and chronic pain. °SEEK IMMEDIATE MEDICAL CARE IF:  °· Your pain increases even during treatment. °· You develop an oral temperature above 102° F (38.9° C) and have heat and inflammation over the involved bursa. °MAKE SURE YOU:  °· Understand these instructions. °· Will watch your condition. °· Will get help right away if you are not doing well or get worse. °Document Released: 04/07/2000 Document Revised: 07/03/2011 Document Reviewed: 06/30/2013 °ExitCare® Patient Information ©2015 [employer]. This information is not intended to replace advice given to you by your health care provider. Make sure you discuss any questions you have with your health care provider. ° °

## 2014-09-29 NOTE — ED Provider Notes (Signed)
CSN: [id number]     Arrival date & time 09/29/14  1815 History   First MD Initiated Contact with Patient 09/29/14 1900     Chief Complaint  Patient presents with  . Joint Swelling   HPI  Patient reports that he fell and struck his left knee hard in February of this year. Since that time he has had small amount of swelling at the front of the knee, not particularly painful, not preventing him from wound. Hurts a lot to extreme flex his knee and to get down on hands and knees. In the last 3 weeks or so, the patient has been wearing shorts, and his friends have drawn attention to swelling at the front of the left knee. The patient is not aware of any change in pain, or limitation of action, but his friends are aware of the knee now because of wearing shorts. He is here for reevaluation. There is no redness, skin is intact. No fever, no malaise. No family history of gout, no personal history of gout. Patient does have a long history of kidney stones.  Past Medical History  Diagnosis Date  . Paroxysmal a-fib   . Hyperlipidemia   . Paroxysmal a-fib    Past Surgical History  Procedure Laterality Date  . Lithotripsy      x 8   Family History  Problem Relation Age of Onset  . Hypertension Mother   . Heart attack Father 1563   History  Substance Use Topics  . Smoking status: Never Smoker   . Smokeless tobacco: Never Used  . Alcohol Use: No    Review of Systems  All other systems reviewed and are negative.   Allergies  Review of patient's allergies indicates no known allergies.  Home Medications   Prior to Admission medications   Medication Sig Start Date End Date Taking? Authorizing Provider  aspirin 81 MG tablet Take 81 mg by mouth daily.   Yes Historical Provider, MD  Multiple Vitamins-Minerals (CENTRUM SILVER ADULT 50+ PO) Take by mouth.   Yes Historical Provider, MD  Omega-3 Krill Oil 300 MG CAPS Take by mouth.   Yes Historical Provider, MD  simvastatin (ZOCOR) 40 MG tablet Take  40 mg by mouth daily.   Yes Historical Provider, MD  tamsulosin (FLOMAX) 0.4 MG CAPS capsule Take 0.4 mg by mouth.   Yes Historical Provider, MD  metoprolol tartrate (LOPRESSOR) 25 MG tablet Take 1 tablet (25 mg total) by mouth 2 (two) times daily as needed. 09/26/13   Antonieta Ibaimothy J Gollan, MD  Omega-3 Fatty Acids (FISH OIL) 1000 MG CAPS Take 1,000 mg by mouth daily.    Historical Provider, MD   BP 158/98 mmHg  Pulse 73  Temp(Src) 98.2 F (36.8 C) (Oral)  Resp 16  Ht 6' (1.829 m)  Wt 230 lb (104.327 kg)  BMI 31.19 kg/m2  SpO2 99% Physical Exam  Constitutional: He is oriented to person, place, and time. No distress.  Alert, nicely groomed  HENT:  Head: Atraumatic.  Eyes:  Conjugate gaze, no eye redness/drainage  Neck: Neck supple.  Cardiovascular: Normal rate.   Pulmonary/Chest: No respiratory distress.  Abdominal: Soft. He exhibits no distension.  Musculoskeletal: Normal range of motion.  No apparent left knee effusion. There is a large prepatellar effusion on the left. There is warmth to palpation, compared to the right, no erythema, no rash, no bruising. Range of motion of the left knee is full.  Neurological: He is alert and oriented to person, place, and  time.  Skin: Skin is warm and dry.  No cyanosis  Nursing note and vitals reviewed.   ED Course  Procedures (including critical care time) Labs Review Labs Reviewed  BODY FLUID CULTURE  SYNOVIAL CELL COUNT + DIFF, W/ CRYSTALS    Imaging Review Dg Knee Ap/lat W/sunrise Left  09/29/2014   CLINICAL DATA:  Fall in February with swelling noted 3 weeks ago. Pain when kneeling.  EXAM: LEFT KNEE 3 VIEWS  COMPARISON:  None.  FINDINGS: There is marked prepatellar soft tissue swelling without internal mineralization, opaque foreign body, or soft tissue gas.  No joint effusion, fracture, or malalignment. Minimal marginal spurs without joint narrowing.  IMPRESSION: Prepatellar soft tissue swelling suggesting bursitis. No underlying acute  osseous finding.   Electronically Signed   By: Marnee Spring M.D.   On: 09/29/2014 19:55   Aspiration of prepatellar bursa: The anterior left knee was prepped with Hibiclens swabs, and the skin anesthetized with ethyl chloride spray, and a small bleb of 1% lidocaine. 15 cc of thin serosanguineous fluid were aspirated easily from the prepatellar bursa. Samples were sent for cell count and differential, culture, and crystal analysis. A compression bandage was applied. Patient should leave the bandage on for the next 24 hours, and then he can remove it and place with a Band-Aid if needed.  MDM   1. Prepatellar bursitis of left knee    Differential diagnosis includes inflammatory (gout, pseudogout, infectious), posttraumatic (fell in February 2016).  Aspiration may be therapeutic. Patient can return to work at full duty, should recheck at the urgent care in a week or so if fluid re-accumulates, or pain is worse. Medical status questionnaire completed.    Eustace Moore, MD 09/29/14 2041

## 2014-09-30 LAB — SYNOVIAL CELL COUNT + DIFF, W/ CRYSTALS
CRYSTALS FLUID: NONE SEEN
Eosinophils-Synovial: 3 % — ABNORMAL HIGH (ref 0–1)
Lymphocytes-Synovial Fld: 29 % — ABNORMAL HIGH (ref 0–20)
Monocyte-Macrophage-Synovial Fluid: 9 % — ABNORMAL LOW (ref 50–90)
Neutrophil, Synovial: 59 % — ABNORMAL HIGH (ref 0–25)
WBC, Synovial: 530 /mm3 — ABNORMAL HIGH (ref 0–200)

## 2014-10-03 LAB — BODY FLUID CULTURE: CULTURE: NO GROWTH

## 2015-03-10 ENCOUNTER — Encounter: Payer: Self-pay | Admitting: Cardiovascular Disease

## 2015-03-10 ENCOUNTER — Ambulatory Visit (INDEPENDENT_AMBULATORY_CARE_PROVIDER_SITE_OTHER): Payer: No Typology Code available for payment source | Admitting: Cardiovascular Disease

## 2015-03-10 VITALS — BP 122/70 | HR 75 | Ht 72.0 in | Wt 230.8 lb

## 2015-03-10 DIAGNOSIS — G4733 Obstructive sleep apnea (adult) (pediatric): Secondary | ICD-10-CM | POA: Diagnosis not present

## 2015-03-10 DIAGNOSIS — E669 Obesity, unspecified: Secondary | ICD-10-CM | POA: Diagnosis not present

## 2015-03-10 DIAGNOSIS — R079 Chest pain, unspecified: Secondary | ICD-10-CM

## 2015-03-10 DIAGNOSIS — E785 Hyperlipidemia, unspecified: Secondary | ICD-10-CM

## 2015-03-10 DIAGNOSIS — I4891 Unspecified atrial fibrillation: Secondary | ICD-10-CM | POA: Diagnosis not present

## 2015-03-10 MED ORDER — METOPROLOL SUCCINATE ER 25 MG PO TB24: 25.0000 mg | ORAL_TABLET | Freq: Every day | ORAL | Status: DC

## 2015-03-10 NOTE — Assessment & Plan Note (Signed)
Paroxysmal atrial fibrillation. Episode in May 2015, recent episode November 2016 Both converting to normal sinus rhythm after medication Recommended he start metoprolol succinate 25 mg in the evening Would take extra metoprolol tartrate as needed for breakthrough arrhythmia If he does have any significant arrhythmia, would recommend that he call our office He has poor controlled sleep apnea, obesity, high risk of recurrent arrhythmia If he does start to have frequent episodes, may need to start anticoagulation

## 2015-03-10 NOTE — Assessment & Plan Note (Signed)
In the past did not want CPAP. Reports that he does not feel any symptoms concerning for sleep apnea, does not want any additional workup

## 2015-03-10 NOTE — Assessment & Plan Note (Addendum)
Most recent lipid panel not available. Encouraged him to stay on his simvastatin 

## 2015-03-10 NOTE — Progress Notes (Signed)
Patient ID: Marcus Bryant, male    DOB: January 15, 1956, 59 y.o.   MRN: [id number]  HPI Comments: Mr. Verl DickerBunton is a pleasant 59 year old gentleman with history of snoring, obstructive sleep apnea, positive sleep study per the patient and 2006 who does not wear CPAP, kidney stones, hyperlipidemia, abnormal stress test in 2008 who presented to the hospital 09/05/2013 with chest pain, atrial fibrillation. He converted to normal sinus rhythm en route, stress test showed no ischemia, echocardiogram showed normal ejection fraction with dilated left atrium He presents today for follow-up of his atrial fibrillation, paroxysmal  In follow-up today, he reports having recent episode of atrial fibrillation that woke him up at 4 AM He did not have metoprolol. The night before he had a large meal He [facility] until he converted to normal sinus rhythm after medication was delivered Since then he denies any further episodes of tachycardia or palpitations He lives alone, does not know if he still has sleep apnea No regular exercise program.  EKG on today's visit shows normal sinus rhythm with rate 75 bpm, no significant ST or T-wave changes  Other past medical history In 2015, was discharged on metoprolol but after reading the side effects, decided not to take the medication.  He feels that his initial episode of atrial fibrillation was secondary to significant gastric discomfort after overeating including eating a bloomin onion. He tends to overeat at the buffet  Echocardiogram 09/06/2013 showed normal ejection fraction, normal right ventricular systolic pressure, moderately dilated left atrium  Stress test dated 09/06/2013 showed no ischemia, normal study, ejection fraction 70%    No Known Allergies  Current Outpatient Prescriptions on File Prior to Visit  Medication Sig Dispense Refill  . aspirin 81 MG tablet Take 81 mg by mouth daily.    . metoprolol tartrate (LOPRESSOR) 25 MG  tablet Take 1 tablet (25 mg total) by mouth 2 (two) times daily as needed. 60 tablet 3  . Multiple Vitamins-Minerals (CENTRUM SILVER ADULT 50+ PO) Take by mouth.    . Omega-3 Krill Oil 300 MG CAPS Take by mouth.    . simvastatin (ZOCOR) 40 MG tablet Take 40 mg by mouth daily.    . tamsulosin (FLOMAX) 0.4 MG CAPS capsule Take 0.4 mg by mouth as needed.      No current facility-administered medications on file prior to visit.    Past Medical History  Diagnosis Date  . Paroxysmal a-fib (HCC)   . Hyperlipidemia   . Paroxysmal a-fib (HCC)   . Kidney stones     Past Surgical History  Procedure Laterality Date  . Lithotripsy      x 8    Social History  reports that he has never smoked. He has never used smokeless tobacco. He reports that he does not drink alcohol or use illicit drugs.  Family History family history includes Heart attack (age of onset: 4863) in his father; Hypertension in his mother.   Review of Systems  Constitutional: Negative.   Respiratory: Negative.   Cardiovascular: Positive for palpitations.  Gastrointestinal: Negative.   Endocrine: Negative.   Musculoskeletal: Negative.   Neurological: Negative.   Hematological: Negative.   Psychiatric/Behavioral: Negative.   All other systems reviewed and are negative.   BP 122/70 mmHg  Pulse 75  Ht 6' (1.829 m)  Wt 230 lb 12 oz (104.668 kg)  BMI 31.29 kg/m2  Physical Exam  Constitutional: He is oriented to person, place, and time. He appears well-developed and  well-nourished.  HENT:  Head: Normocephalic.  Nose: Nose normal.  Mouth/Throat: Oropharynx is clear and moist.  Eyes: Conjunctivae are normal. Pupils are equal, round, and reactive to light.  Neck: Normal range of motion. Neck supple. No JVD present.  Cardiovascular: Normal rate, regular rhythm, S1 normal, S2 normal, normal heart sounds and intact distal pulses.  Exam reveals no gallop and no friction rub.   No murmur heard. Pulmonary/Chest: Effort  normal and breath sounds normal. No respiratory distress. He has no wheezes. He has no rales. He exhibits no tenderness.  Abdominal: Soft. Bowel sounds are normal. He exhibits no distension. There is no tenderness.  Musculoskeletal: Normal range of motion. He exhibits no edema or tenderness.  Lymphadenopathy:    He has no cervical adenopathy.  Neurological: He is alert and oriented to person, place, and time. Coordination normal.  Skin: Skin is warm and dry. No rash noted. No erythema.  Psychiatric: He has a normal mood and affect. His behavior is normal. Judgment and thought content normal.      Assessment and Plan   Nursing note and vitals reviewed.

## 2015-03-10 NOTE — Assessment & Plan Note (Signed)
No recent episodes of chest discomfort. No further workup at this time 

## 2015-03-10 NOTE — Patient Instructions (Addendum)
You are doing well.  Please start metoprolol succinate one a day in the evening for atrial fib Take the metoprolol tartrate (short acting for breakthrough afib) Please call if you have frequent episodes  Please call us if you have new issues that need to be addressed before your next appt.  Your physician wants you to follow-up in: 6 months.  You will receive a reminder letter in the mail two months in advance. If you don't receive a letter, please call our office to schedule the follow-up appointment.

## 2015-03-10 NOTE — Assessment & Plan Note (Signed)
We have encouraged continued exercise, careful diet management in an effort to lose weight. 

## 2015-04-08 ENCOUNTER — Telehealth: Payer: Self-pay | Admitting: *Deleted

## 2015-04-08 NOTE — Telephone Encounter (Signed)
Left pt detailed message advising him to avoid OTC cold meds w/ decongestants in them. Advised him to speak w/ pharmacist for specific meds if he would like names of specific cold meds to take/

## 2015-04-08 NOTE — Telephone Encounter (Signed)
Pt calling stating he has a cold and now that we have put him on heart medication the pharmacy won't let him buy anything He would like to know what he is allowed to take Please advise.

## 2015-04-09 ENCOUNTER — Ambulatory Visit: Payer: Self-pay | Admitting: Physician Assistant

## 2015-11-02 ENCOUNTER — Ambulatory Visit
Admission: EM | Admit: 2015-11-02 | Discharge: 2015-11-02 | Disposition: A | Discharge status: Home / Self Care | Payer: BLUE CROSS/BLUE SHIELD | Attending: Internal Medicine | Admitting: Internal Medicine

## 2015-11-02 DIAGNOSIS — J069 Acute upper respiratory infection, unspecified: Secondary | ICD-10-CM

## 2015-11-02 MED ORDER — BENZONATATE 200 MG PO CAPS: 200.0000 mg | ORAL_CAPSULE | Freq: Three times a day (TID) | ORAL | Status: DC

## 2015-11-02 MED ORDER — PREDNISONE 20 MG PO TABS: ORAL_TABLET | ORAL | Status: DC

## 2015-11-02 MED ORDER — HYDROCOD POLST-CPM POLST ER 10-8 MG/5ML PO SUER: 5.0000 mL | Freq: Two times a day (BID) | ORAL | Status: DC

## 2015-11-02 NOTE — ED Notes (Addendum)
Patient presents with a cough that started around July 1, he had a cold the first of July. He was able to shake the cold but not the cough. Cough is sometimes productive.

## 2015-11-02 NOTE — ED Provider Notes (Signed)
CSN: [id number]     Arrival date & time 11/02/15  0945 History   First MD Initiated Contact with Patient 11/02/15 1021     Chief Complaint  Patient presents with  . Cough   (Consider location/radiation/quality/duration/timing/severity/associated sxs/prior Treatment) HPI  This is a 60 year old male who presents with a cough beginning approximately 11 days ago. Symptoms started as a runny nose and a "cold". It seemed to subside but now is left with this relatively nonproductive cough. If It is productive it is usually just clear mucus. He denies any fever or chills. He is a nonsmoker. Has had occasional wheezing but that is not a prominent feature. Today's temperature is 99.3 pulse 57 blood pressure 123/78 and O2 sat on room air is 97%     Past Medical History  Diagnosis Date  . Paroxysmal a-fib (HCC)   . Hyperlipidemia   . Paroxysmal a-fib (HCC)   . Kidney stones    Past Surgical History  Procedure Laterality Date  . Lithotripsy      x 8   Family History  Problem Relation Age of Onset  . Hypertension Mother   . Heart attack Father 1263   Social History  Substance Use Topics  . Smoking status: Never Smoker   . Smokeless tobacco: Never Used  . Alcohol Use: No    Review of Systems  Constitutional: Negative for fever, chills, activity change and fatigue.  HENT: Positive for sore throat. Negative for congestion, postnasal drip, rhinorrhea and sinus pressure.   Respiratory: Positive for cough and wheezing. Negative for shortness of breath and stridor.   All other systems reviewed and are negative.   Allergies  Review of patient's allergies indicates no known allergies.  Home Medications   Prior to Admission medications   Medication Sig Start Date End Date Taking? Authorizing Provider  aspirin 81 MG tablet Take 81 mg by mouth daily.   Yes Historical Provider, MD  metoprolol succinate (TOPROL XL) 25 MG 24 hr tablet Take 1 tablet (25 mg total) by mouth daily. 03/10/15  Yes  Antonieta Ibaimothy J Gollan, MD  Multiple Vitamins-Minerals (CENTRUM SILVER ADULT 50+ PO) Take by mouth.   Yes Historical Provider, MD  Omega-3 Krill Oil 300 MG CAPS Take by mouth.   Yes Historical Provider, MD  simvastatin (ZOCOR) 40 MG tablet Take 40 mg by mouth daily.   Yes Historical Provider, MD  benzonatate (TESSALON) 200 MG capsule Take 1 capsule (200 mg total) by mouth every 8 (eight) hours. 11/02/15   Lutricia FeilWilliam P Kayce Betty, PA-C  chlorpheniramine-HYDROcodone (TUSSIONEX PENNKINETIC ER) 10-8 MG/5ML SUER Take 5 mLs by mouth 2 (two) times daily. 11/02/15   Lutricia FeilWilliam P Cohen Doleman, PA-C  metoprolol tartrate (LOPRESSOR) 25 MG tablet Take 1 tablet (25 mg total) by mouth 2 (two) times daily as needed. 09/26/13   Antonieta Ibaimothy J Gollan, MD  predniSONE (DELTASONE) 20 MG tablet Take 2 tablets (40 mg) daily by mouth 11/02/15   Lutricia FeilWilliam P Cullan Launer, PA-C  tamsulosin (FLOMAX) 0.4 MG CAPS capsule Take 0.4 mg by mouth as needed.     Historical Provider, MD   Meds Ordered and Administered this Visit  Medications - No data to display  BP 123/78 mmHg  Pulse 57  Temp(Src) 99.3 F (37.4 C) (Oral)  Resp 18  Ht 6' (1.829 m)  Wt 230 lb (104.327 kg)  BMI 31.19 kg/m2  SpO2 97% No data found.   Physical Exam  Constitutional: He is oriented to person, place, and time. He appears well-developed and well-nourished.  No distress.  HENT:  Head: Normocephalic and atraumatic.  Right Ear: External ear normal.  Left Ear: External ear normal.  Nose: Nose normal.  Mouth/Throat: Oropharynx is clear and moist.  Eyes: Conjunctivae are normal. Pupils are equal, round, and reactive to light. Right eye exhibits no discharge. Left eye exhibits no discharge.  Neck: Normal range of motion. Neck supple.  Pulmonary/Chest: Effort normal and breath sounds normal. No respiratory distress. He has no wheezes. He has no rales.  Musculoskeletal: Normal range of motion. He exhibits no edema or tenderness.  Lymphadenopathy:    He has no cervical adenopathy.   Neurological: He is alert and oriented to person, place, and time.  Skin: Skin is warm and dry. He is not diaphoretic.  Psychiatric: He has a normal mood and affect. His behavior is normal. Judgment and thought content normal.  Nursing note and vitals reviewed.   ED Course  Procedures (including critical care time)  Labs Review Labs Reviewed - No data to display  Imaging Review No results found.   Visual Acuity Review  Right Eye Distance:   Left Eye Distance:   Bilateral Distance:    Right Eye Near:   Left Eye Near:    Bilateral Near:         MDM   1. Acute URI    Discharge Medication List as of 11/02/2015 10:39 AM    START taking these medications   Details  benzonatate (TESSALON) 200 MG capsule Take 1 capsule (200 mg total) by mouth every 8 (eight) hours., Starting 11/02/2015, Until Discontinued, Normal    chlorpheniramine-HYDROcodone (TUSSIONEX PENNKINETIC ER) 10-8 MG/5ML SUER Take 5 mLs by mouth 2 (two) times daily., Starting 11/02/2015, Until Discontinued, Print    predniSONE (DELTASONE) 20 MG tablet Take 2 tablets (40 mg) daily by mouth, Normal      Plan: 1. Test/x-ray results and diagnosis reviewed with patient 2. rx as per orders; risks, benefits, potential side effects reviewed with patient 3. Recommend supportive treatment with Rest and increase fluids. He may benefit from a cool mist vaporizer at nighttime. I told him this is most likely a viral illness and does not require antibiotics at this time. However if he did develop a high fever or productive cough he should return for further evaluation including a chest x-ray. He may follow-up with his primary care or return to our clinic. Cautioned him with regard to the use of Tussionex and activities that require concentration and judgment and he should not drive while taking the medication 4. F/u prn if symptoms worsen or don't improve     NUMAN ZYLSTRA, PA-C 11/02/15 1046

## 2015-11-02 NOTE — Discharge Instructions (Signed)
Cool Mist Vaporizers °Vaporizers may help relieve the symptoms of a cough and cold. They add moisture to the air, which helps mucus to become thinner and less sticky. This makes it easier to breathe and cough up secretions. Cool mist vaporizers do not cause serious burns like hot mist vaporizers, which may also be called steamers or humidifiers. Vaporizers have not been proven to help with colds. You should not use a vaporizer if you are allergic to mold. °HOME CARE INSTRUCTIONS °· Follow the package instructions for the vaporizer. °· Do not use anything other than distilled water in the vaporizer. °· Do not run the vaporizer all of the time. This can cause mold or bacteria to grow in the vaporizer. °· Clean the vaporizer after each time it is used. °· Clean and dry the vaporizer well before storing it. °· Stop using the vaporizer if worsening respiratory symptoms develop. °  °This information is not intended to replace advice given to you by your health care provider. Make sure you discuss any questions you have with your health care provider. °  °Document Released: 01/06/2004 Document Revised: 04/15/2013 Document Reviewed: 08/28/2012 °Elsevier Interactive Patient Education ©2016 [employer] ° °Upper Respiratory Infection, Adult °Most upper respiratory infections (URIs) are a viral infection of the air passages leading to the lungs. A URI affects the nose, throat, and upper air passages. The most common type of URI is nasopharyngitis and is typically referred to as "the common cold." °URIs run their course and usually go away on their own. Most of the time, a URI does not require medical attention, but sometimes a bacterial infection in the upper airways can follow a viral infection. This is called a secondary infection. Sinus and middle ear infections are common types of secondary upper respiratory infections. °Bacterial pneumonia can also complicate a URI. A URI can worsen asthma and chronic obstructive  pulmonary disease (COPD). Sometimes, these complications can require emergency medical care and may be life threatening.  °CAUSES °Almost all URIs are caused by viruses. A virus is a type of germ and can spread from one person to another.  °RISKS FACTORS °You may be at risk for a URI if:  °· You smoke.   °· You have chronic heart or lung disease. °· You have a weakened defense (immune) system.   °· You are very young or very old.   °· You have nasal allergies or asthma. °· You work in crowded or poorly ventilated areas. °· You work in health care facilities or schools. °SIGNS AND SYMPTOMS  °Symptoms typically develop 2-3 days after you come in contact with a cold virus. Most viral URIs last 7-10 days. However, viral URIs from the influenza virus (flu virus) can last 14-18 days and are typically more severe. Symptoms may include:  °· Runny or stuffy (congested) nose.   °· Sneezing.   °· Cough.   °· Sore throat.   °· Headache.   °· Fatigue.   °· Fever.   °· Loss of appetite.   °· Pain in your forehead, behind your eyes, and over your cheekbones (sinus pain). °· Muscle aches.   °DIAGNOSIS  °Your health care provider may diagnose a URI by: °· Physical exam. °· Tests to check that your symptoms are not due to another condition such as: °¨ Strep throat. °¨ Sinusitis. °¨ Pneumonia. °¨ Asthma. °TREATMENT  °A URI goes away on its own with time. It cannot be cured with medicines, but medicines may be prescribed or recommended to relieve symptoms. Medicines may help: °· Reduce your fever. °· Reduce   your cough. °· Relieve nasal congestion. °HOME CARE INSTRUCTIONS  °· Take medicines only as directed by your health care provider.   °· Gargle warm saltwater or take cough drops to comfort your throat as directed by your health care provider. °· Use a warm mist humidifier or inhale steam from a shower to increase air moisture. This may make it easier to breathe. °· Drink enough fluid to keep your urine clear or pale yellow.   °· Eat  soups and other clear broths and maintain good nutrition.   °· Rest as needed.   °· Return to work when your temperature has returned to normal or as your health care provider advises. You may need to stay home longer to avoid infecting others. You can also use a face mask and careful hand washing to prevent spread of the virus. °· Increase the usage of your inhaler if you have asthma.   °· Do not use any tobacco products, including cigarettes, chewing tobacco, or electronic cigarettes. If you need help quitting, ask your health care provider. °PREVENTION  °The best way to protect yourself from getting a cold is to practice good hygiene.  °· Avoid oral or hand contact with people with cold symptoms.   °· Wash your hands often if contact occurs.   °There is no clear evidence that vitamin C, vitamin E, echinacea, or exercise reduces the chance of developing a cold. However, it is always recommended to get plenty of rest, exercise, and practice good nutrition.  °SEEK MEDICAL CARE IF:  °· You are getting worse rather than better.   °· Your symptoms are not controlled by medicine.   °· You have chills. °· You have worsening shortness of breath. °· You have brown or red mucus. °· You have yellow or brown nasal discharge. °· You have pain in your face, especially when you bend forward. °· You have a fever. °· You have swollen neck glands. °· You have pain while swallowing. °· You have white areas in the back of your throat. °SEEK IMMEDIATE MEDICAL CARE IF:  °· You have severe or persistent: °¨ Headache. °¨ Ear pain. °¨ Sinus pain. °¨ Chest pain. °· You have chronic lung disease and any of the following: °¨ Wheezing. °¨ Prolonged cough. °¨ Coughing up blood. °¨ A change in your usual mucus. °· You have a stiff neck. °· You have changes in your: °¨ Vision. °¨ Hearing. °¨ Thinking. °¨ Mood. °MAKE SURE YOU:  °· Understand these instructions. °· Will watch your condition. °· Will get help right away if you are not doing well or  get worse. °  °This information is not intended to replace advice given to you by your health care provider. Make sure you discuss any questions you have with your health care provider. °  °Document Released: 10/04/2000 Document Revised: 08/25/2014 Document Reviewed: 07/16/2013 °Elsevier Interactive Patient Education ©2016 Elsevier Inc. ° °

## 2016-01-18 ENCOUNTER — Ambulatory Visit (INDEPENDENT_AMBULATORY_CARE_PROVIDER_SITE_OTHER): Payer: BLUE CROSS/BLUE SHIELD | Admitting: Cardiovascular Disease

## 2016-01-18 ENCOUNTER — Encounter: Payer: Self-pay | Admitting: Cardiovascular Disease

## 2016-01-18 VITALS — BP 120/80 | HR 56 | Ht 72.0 in | Wt 239.5 lb

## 2016-01-18 DIAGNOSIS — G4733 Obstructive sleep apnea (adult) (pediatric): Secondary | ICD-10-CM

## 2016-01-18 DIAGNOSIS — I4891 Unspecified atrial fibrillation: Secondary | ICD-10-CM | POA: Diagnosis not present

## 2016-01-18 DIAGNOSIS — E785 Hyperlipidemia, unspecified: Secondary | ICD-10-CM

## 2016-01-18 DIAGNOSIS — Z23 Encounter for immunization: Secondary | ICD-10-CM

## 2016-01-18 DIAGNOSIS — E669 Obesity, unspecified: Secondary | ICD-10-CM

## 2016-01-18 NOTE — Patient Instructions (Signed)

## 2016-01-18 NOTE — Progress Notes (Signed)
Cardiology Office Note  Date:  01/18/2016   ID:  Marcus Bryant, DOB 1955-08-02, MRN [id number]  PCP:  Marina GoodellFELDPAUSCH, DALE E, MD   Chief Complaint  Patient presents with  . other    6 month follow up. Meds reviewed by the pt. verbally. "doing well."     HPI:  Mr. Verl DickerBunton is a pleasant 60 year old gentleman with history of snoring, obstructive sleep apnea, positive sleep study per the patient and 2006 who does not wear CPAP, kidney stones, hyperlipidemia, abnormal stress test in 2008 who presented to the hospital 09/05/2013 with chest pain, atrial fibrillation. He converted to normal sinus rhythm en route, stress test showed no ischemia, echocardiogram showed normal ejection fraction with dilated left atrium He presents today for follow-up of his atrial fibrillation, paroxysmal  One episode one month ago, "felt funny", ate thick slice of onion Took metoprolol, 30 min later felt fine May have been indigestion Tends to overeat, family reunion this past week, ate 2 plates Eats out a lot, weight up  He lives alone, does not know if he still has sleep apnea No regular exercise program.  Recent labs reviewed total chol 180, LDL 107  EKG on today's visit shows normal sinus rhythm with rate 56 bpm, no significant ST or T-wave changes  Other past medical history In 2015, was discharged on metoprolol but after reading the side effects, decided not to take the medication.  He feels that his initial episode of atrial fibrillation was secondary to significant gastric discomfort after overeating including eating a bloomin onion. He tends to overeat at the buffet  Echocardiogram 09/06/2013 showed normal ejection fraction, normal right ventricular systolic pressure, moderately dilated left atrium  Stress test dated 09/06/2013 showed no ischemia, normal study, ejection fraction 70%  PMH:   has a past medical history of Hyperlipidemia; Kidney stones; Paroxysmal a-fib (HCC); and Paroxysmal  a-fib (HCC).  PSH:    Past Surgical History:  Procedure Laterality Date  . LITHOTRIPSY     x 8    Current Outpatient Prescriptions  Medication Sig Dispense Refill  . aspirin 81 MG tablet Take 81 mg by mouth daily.    . metoprolol succinate (TOPROL XL) 25 MG 24 hr tablet Take 1 tablet (25 mg total) by mouth daily. 30 tablet 11  . metoprolol tartrate (LOPRESSOR) 25 MG tablet Take 1 tablet (25 mg total) by mouth 2 (two) times daily as needed. 60 tablet 3  . Multiple Vitamins-Minerals (CENTRUM SILVER ADULT 50+ PO) Take by mouth.    . Omega-3 Krill Oil 300 MG CAPS Take by mouth.    . simvastatin (ZOCOR) 40 MG tablet Take 40 mg by mouth daily.    . tamsulosin (FLOMAX) 0.4 MG CAPS capsule Take 0.4 mg by mouth as needed.      No current facility-administered medications for this visit.      Allergies:   Review of patient's allergies indicates no known allergies.   Social History:  The patient  reports that he has never smoked. He has never used smokeless tobacco. He reports that he does not drink alcohol or use drugs.   Family History:   family history includes Heart attack (age of onset: 6663) in his father; Hypertension in his mother.    Review of Systems: Review of Systems  Constitutional: Negative.   Respiratory: Negative.   Cardiovascular: Negative.   Gastrointestinal: Positive for heartburn.  Musculoskeletal: Negative.   Neurological: Negative.   Psychiatric/Behavioral: Negative.   All other systems reviewed and  are negative.    PHYSICAL EXAM: VS:  BP 120/80 (BP Location: Left Arm, Patient Position: Sitting, Cuff Size: Normal)   Pulse (!) 56   Ht 6' (1.829 m)   Wt 239 lb 8 oz (108.6 kg)   BMI 32.48 kg/m  , BMI Body mass index is 32.48 kg/m. GEN: Well nourished, well developed, in no acute distress, obese  HEENT: normal  Neck: no JVD, carotid bruits, or masses Cardiac: RRR; no murmurs, rubs, or gallops,no edema  Respiratory:  clear to auscultation bilaterally, normal  work of breathing GI: soft, nontender, nondistended, + BS MS: no deformity or atrophy  Skin: warm and dry, no rash Neuro:  Strength and sensation are intact Psych: euthymic mood, full affect    Recent Labs: No results found for requested labs within last 8760 hours.    Lipid Panel Lab Results  Component Value Date   CHOL 175 09/06/2013   HDL 29 (L) 09/06/2013   LDLCALC 117 (H) 09/06/2013   TRIG 145 09/06/2013      Wt Readings from Last 3 Encounters:  01/18/16 239 lb 8 oz (108.6 kg)  11/02/15 230 lb (104.3 kg)  03/10/15 230 lb 12 oz (104.7 kg)       ASSESSMENT AND PLAN:  Atrial fibrillation, unspecified type (HCC) - Plan: EKG 12-Lead Denies any symptoms concerning for atrial fibrillation Recommended he continue metoprolol succinate daily Tartrate as needed for palpitations  Encounter for immunization - Plan: Flu Vaccine QUAD 36+ mos IM He received his flu shot today  Hyperlipidemia Cholesterol stable, mildly elevated, recommended he moderate his diet  Obesity Dietary guide provided, recommended low carbohydrates including breads, potatoes Discussed his habits, he lives alone, eats out most of the time  Obstructive sleep apnea Unclear if he has sleep apnea as he does not know if he snores or not Does not want to wear a CPAP Denies symptoms of significant daytime somnolence   Total encounter time more than 25 minutes  Greater than 50% was spent in counseling and coordination of care with the patient   Disposition:   F/U  12 months   Orders Placed This Encounter  Procedures  . Flu Vaccine QUAD 36+ mos IM  . EKG 12-Lead     Signed, Dossie Arbour, M.D., Ph.D. 01/18/2016  [facility] [place], [place] [phone]

## 2016-03-01 ENCOUNTER — Other Ambulatory Visit: Payer: Self-pay | Admitting: Cardiovascular Disease

## 2016-04-25 DIAGNOSIS — N132 Hydronephrosis with renal and ureteral calculous obstruction: Secondary | ICD-10-CM | POA: Insufficient documentation

## 2016-08-29 ENCOUNTER — Ambulatory Visit: Payer: BLUE CROSS/BLUE SHIELD | Admitting: Podiatry

## 2016-09-06 ENCOUNTER — Telehealth: Payer: Self-pay | Admitting: Cardiovascular Disease

## 2016-09-06 NOTE — Telephone Encounter (Signed)
See below

## 2016-09-06 NOTE — Telephone Encounter (Signed)
Pt calling stating he went three day's without his metoprolol   He started it back last night and the day before  But while being off it, he's been having some arm pains along with chest pains.  He kept forgetting to pick it up  He states no longer having the chest pain but a little in the arm pains.  Please advise

## 2016-09-06 NOTE — Telephone Encounter (Signed)
S/w pt. He reports he did not take metoprolol May 11 -13 as he ran out and forgot to pick it up from the pharmacy.  During that time, he did not experience symptoms. States he felt fine. Sunday, he helped move heavy tables and chairs. Monday, he resumed metoprolol XL. Tuesday, he had 1 out of 10 chest discomfort and "just a little bit" of arm pain but feels he may have overexerted himself while moving tables and chairs. Sx resolved. Today, he has slight arm tingling. His friend found out pt had missed 3 doses of metoprolol and told him to call our office as he knew of someone who missed metoprolol doeses, had a heart attack and died. Pt is worried this could happen.  He does not take BP or HR at home therefore, he has no readings. Advised pt to monitor VS and sx and continue all medications as scheduled. He may take tylenol for muscle pain, ice the affected arm and f/u w/PCP if he feels muscle pain is r/t overexertion. Reviewed s/s that would require immediate evaluation in the ER. Offered 90 day refills of metoprolol for better compliance but he reports already receiving 90 days at a time. He will continue to monitor and call back if he has any other questions.

## 2016-09-06 NOTE — Telephone Encounter (Signed)
Left voicemail message to call back  

## 2016-09-12 ENCOUNTER — Ambulatory Visit (INDEPENDENT_AMBULATORY_CARE_PROVIDER_SITE_OTHER): Payer: BLUE CROSS/BLUE SHIELD | Admitting: Podiatry

## 2016-09-12 ENCOUNTER — Encounter: Payer: Self-pay | Admitting: Podiatry

## 2016-09-12 ENCOUNTER — Ambulatory Visit (INDEPENDENT_AMBULATORY_CARE_PROVIDER_SITE_OTHER): Payer: BLUE CROSS/BLUE SHIELD

## 2016-09-12 DIAGNOSIS — M7731 Calcaneal spur, right foot: Secondary | ICD-10-CM

## 2016-09-12 DIAGNOSIS — M722 Plantar fascial fibromatosis: Secondary | ICD-10-CM

## 2016-09-12 DIAGNOSIS — M7661 Achilles tendinitis, right leg: Secondary | ICD-10-CM | POA: Diagnosis not present

## 2016-09-12 DIAGNOSIS — M7751 Other enthesopathy of right foot: Secondary | ICD-10-CM

## 2016-09-12 MED ORDER — METHYLPREDNISOLONE 4 MG PO TBPK: ORAL_TABLET | ORAL | 0 refills | Status: DC

## 2016-09-12 MED ORDER — DICLOFENAC SODIUM 75 MG PO TBEC: 75.0000 mg | DELAYED_RELEASE_TABLET | Freq: Two times a day (BID) | ORAL | 0 refills | Status: DC

## 2016-09-12 NOTE — Progress Notes (Signed)
   Subjective: Patient presents today for sharp, aching pain and tenderness in the right heel that has been ongoing for the past 6-8 months. Walking and standing exacerbate the pain. He has been wearing insoles that help alleviate the pain. Patient presents today for further treatment and evaluation  Objective: Physical Exam General: The patient is alert and oriented x3 in no acute distress.  Dermatology: Skin is warm, dry and supple bilateral lower extremities. Negative for open lesions or macerations bilateral.   Vascular: Dorsalis Pedis and Posterior Tibial pulses palpable bilateral.  Capillary fill time is immediate to all digits.  Neurological: Epicritic and protective threshold intact bilateral.   Musculoskeletal: Tenderness to palpation at the medial calcaneal tubercale and through the insertion of the plantar fascia of the right foot. Tenderness to palpation at the insertion of the right Achilles tendon. All other joints range of motion within normal limits bilateral. Strength 5/5 in all groups bilateral.   Radiographic exam: Normal osseous mineralization. Joint spaces preserved. No fracture/dislocation/boney destruction. Calcaneal spur present with mild thickening of plantar fascia right. No other soft tissue abnormalities or radiopaque foreign bodies.  Posterior heel spur noted to the right lower extremity on lateral view  Assessment: 1. Plantar fasciitis right 2. Pain in right foot 3. Insertional Achilles tendinitis right 4. Posterior heel spur right  Plan of Care:  1. Patient evaluated. Xrays reviewed.   2. Injection of 0.5cc Celestone soluspan injected into the right heel at the insertion of the plantar fascia.  3. Injection of 0.5 mL Celestone Soluspan injected into the retrocalcaneal bursa/Achilles tendon of the RLE. Care was taken to avoid direct injection into the tendon. 4. Instructed patient regarding therapies and modalities at home to alleviate symptoms.  5. Rx for  Medrol Dosepak given to patient. 6. Rx for diclofenac 75 mg given to patient. 7. Plantar fascial band(s) dispensed. 8. Night splint dispensed. 9. Return to clinic in 4 weeks.     Felecia ShellingBrent M. Evans, DPM [facility]  Dr. Felecia ShellingBrent M. Evans, DPM    [address]                                        [place], [place] [address]                Office [phone]) [phone]  Fax [phone]) [phone]

## 2016-09-15 MED ORDER — BETAMETHASONE SOD PHOS & ACET 6 (3-3) MG/ML IJ SUSP: 3.0000 mg | Freq: Once | INTRAMUSCULAR | Status: DC

## 2016-10-10 ENCOUNTER — Ambulatory Visit (INDEPENDENT_AMBULATORY_CARE_PROVIDER_SITE_OTHER): Payer: BLUE CROSS/BLUE SHIELD | Admitting: Podiatry

## 2016-10-10 ENCOUNTER — Encounter: Payer: Self-pay | Admitting: Podiatry

## 2016-10-10 DIAGNOSIS — M722 Plantar fascial fibromatosis: Secondary | ICD-10-CM | POA: Diagnosis not present

## 2016-10-10 DIAGNOSIS — M7661 Achilles tendinitis, right leg: Secondary | ICD-10-CM | POA: Diagnosis not present

## 2016-10-14 NOTE — Progress Notes (Signed)
   Subjective: Patient presents today for follow-up evaluation of plantar fasciitis of the right foot and insertional Achilles tendinitis of the RLE. He states he is feeling much better and denies any pain at this time. He states the injection, medications and wearing new shoes have helped alleviate the pain. He denies any new complaints at this time.  Objective: Physical Exam General: The patient is alert and oriented x3 in no acute distress.  Dermatology: Skin is warm, dry and supple bilateral lower extremities. Negative for open lesions or macerations bilateral.   Vascular: Dorsalis Pedis and Posterior Tibial pulses palpable bilateral.  Capillary fill time is immediate to all digits.  Neurological: Epicritic and protective threshold intact bilateral.   Musculoskeletal: Tenderness to palpation at the medial calcaneal tubercale and through the insertion of the plantar fascia of the right foot. Tenderness to palpation at the insertion of the right Achilles tendon. All other joints range of motion within normal limits bilateral. Strength 5/5 in all groups bilateral.    Assessment: 1. Plantar fasciitis right 2. Pain in right foot 3. Insertional Achilles tendinitis right  Plan of Care:  1. Patient evaluated.  2. Continue wearing good shoe gear. 3. Prescription for diclofenac 75 mg when necessary given to patient. 4. Continue wearing night splint and plantar fascial brace when necessary.  5. Return to clinic when necessary.   Felecia ShellingBrent M. Evans, DPM [facility]  Dr. Felecia ShellingBrent M. Evans, DPM    [address]                                        [place], [place] [address]                Office [phone]) [phone]  Fax [phone]) [phone]

## 2017-01-23 ENCOUNTER — Ambulatory Visit: Payer: BLUE CROSS/BLUE SHIELD

## 2017-01-23 ENCOUNTER — Encounter: Payer: Self-pay | Admitting: Podiatry

## 2017-01-23 ENCOUNTER — Ambulatory Visit (INDEPENDENT_AMBULATORY_CARE_PROVIDER_SITE_OTHER): Payer: BLUE CROSS/BLUE SHIELD | Admitting: Podiatry

## 2017-01-23 DIAGNOSIS — M7751 Other enthesopathy of right foot: Secondary | ICD-10-CM

## 2017-01-23 DIAGNOSIS — M79671 Pain in right foot: Secondary | ICD-10-CM

## 2017-01-23 MED ORDER — DICLOFENAC SODIUM 75 MG PO TBEC: 75.0000 mg | DELAYED_RELEASE_TABLET | Freq: Two times a day (BID) | ORAL | 2 refills | Status: DC

## 2017-01-24 NOTE — Progress Notes (Signed)
   Subjective: Patient presents today for follow-up evaluation of plantar fasciitis of the right foot and insertional Achilles tendinitis of the RLE. He reports continued pain to the plantar aspect of the right heel. The severity of the pain depends on how many hours he works in a day. He states the injection he received at the last visit helped to provide relief of the pain. He also reports a burning sensation that he describes as a bee sting to the lateral side of the right ankle near his Achilles that began 3 weeks ago. There are no modifying factors noted. He is here for further evaluation and treatment.   Past Medical History:  Diagnosis Date  . Hyperlipidemia   . Kidney stones   . Paroxysmal A-fib (HCC)   . Paroxysmal A-fib (HCC)      Objective: Physical Exam General: The patient is alert and oriented x3 in no acute distress.  Dermatology: Skin is warm, dry and supple bilateral lower extremities. Negative for open lesions or macerations bilateral.   Vascular: Dorsalis Pedis and Posterior Tibial pulses palpable bilateral.  Capillary fill time is immediate to all digits.  Neurological: Epicritic and protective threshold intact bilateral.   Musculoskeletal: Tenderness to palpation at the medial calcaneal tubercale and through the insertion of the plantar fascia of the right foot. Tenderness to palpation at the insertion of the right Achilles tendon. All other joints range of motion within normal limits bilateral. Strength 5/5 in all groups bilateral.    Assessment: 1. Insertional Achilles tendinitis right  Plan of Care:  1. Patient evaluated.  2. Injection of 0.5 mLs of Celestone Soluspan injected into the retrocalcaneal bursa. Care was taken to avoid direct injection into the tendon. 3. Achilles silicone sleeve dispensed. 4. Continue wearing night splint. 5. Prescription for diclofenac 75 mg #60 given to patient. 6. Return to clinic in 4 weeks.    Felecia Shelling,  DPM [facility]  Dr. Felecia Shelling, DPM    [address]                                        [place], [place] [address]                Office [phone]  Fax [phone]

## 2017-01-31 MED ORDER — BETAMETHASONE SOD PHOS & ACET 6 (3-3) MG/ML IJ SUSP: 3.0000 mg | Freq: Once | INTRAMUSCULAR | Status: DC

## 2017-02-20 ENCOUNTER — Encounter: Payer: Self-pay | Admitting: Podiatry

## 2017-02-20 ENCOUNTER — Ambulatory Visit (INDEPENDENT_AMBULATORY_CARE_PROVIDER_SITE_OTHER): Payer: BLUE CROSS/BLUE SHIELD | Admitting: Podiatry

## 2017-02-20 DIAGNOSIS — M7661 Achilles tendinitis, right leg: Secondary | ICD-10-CM

## 2017-02-22 NOTE — Progress Notes (Signed)
   Subjective: Patient presents today for follow-up evaluation of right insertional Achilles tendinitis. He states his condition has improved and rates the pain currently at 1-2/10. He states the pain is in the right heel and flares up when wearing boots. He is here for further evaluation and treatment.   Past Medical History:  Diagnosis Date  . Hyperlipidemia   . Kidney stones   . Paroxysmal A-fib (HCC)   . Paroxysmal A-fib (HCC)      Objective: Physical Exam General: The patient is alert and oriented x3 in no acute distress.  Dermatology: Skin is warm, dry and supple bilateral lower extremities. Negative for open lesions or macerations bilateral.   Vascular: Dorsalis Pedis and Posterior Tibial pulses palpable bilateral.  Capillary fill time is immediate to all digits.  Neurological: Epicritic and protective threshold intact bilateral.   Musculoskeletal: Tenderness to palpation at the medial calcaneal tubercale and through the insertion of the plantar fascia of the right foot. Tenderness to palpation at the insertion of the right Achilles tendon. All other joints range of motion within normal limits bilateral. Strength 5/5 in all groups bilateral.    Assessment: 1. Insertional Achilles tendinitis right  Plan of Care:  1. Patient evaluated.  2. Continue wearing silicone heel sleeve and night splint. 3. Continue taking diclofenac when necessary. 4. Recommended shoes that do not rub heel. 5. Return to clinic when necessary.   Felecia ShellingBrent M. Letica Giaimo, DPM [facility]  Dr. Felecia ShellingBrent M. Madalyn Legner, DPM    [address]                                        [place], [place] [address]                Office [phone]) [phone]  Fax [phone]) [phone]

## 2017-02-27 ENCOUNTER — Other Ambulatory Visit: Payer: Self-pay | Admitting: Cardiovascular Disease

## 2017-03-01 ENCOUNTER — Other Ambulatory Visit: Payer: Self-pay

## 2017-03-01 MED ORDER — METOPROLOL SUCCINATE ER 25 MG PO TB24: 25.0000 mg | ORAL_TABLET | Freq: Every day | ORAL | 0 refills | Status: DC

## 2017-03-01 NOTE — Telephone Encounter (Signed)
Requested Prescriptions   Signed Prescriptions Disp Refills  . metoprolol succinate (TOPROL-XL) 25 MG 24 hr tablet 90 tablet 0    Sig: Take 1 tablet (25 mg total) daily by mouth.    Authorizing Provider: Antonieta IbaGOLLAN, TIMOTHY J    Ordering User: Margrett RudSLAYTON, Breeann Reposa N

## 2017-03-27 ENCOUNTER — Ambulatory Visit: Payer: Self-pay | Admitting: Cardiovascular Disease

## 2017-04-29 NOTE — Progress Notes (Signed)
Cardiology Office Note  Date:  05/01/2017   ID:  Marcus Bryant, DOB 1956/03/03, MRN [id number]  PCP:  Marina GoodellFeldpausch, Marcus E, MD   Chief Complaint  Patient presents with  . Other    12 month follow up. Patient c/o left arm numbless and ache feeling. Meds reviewed verbally with patient.     HPI:  Marcus Bryant is a pleasant 62 year old gentleman with history of  snoring, obstructive sleep apnea, positive sleep study per the patient and 2006  does not wear CPAP,  kidney stones,  hyperlipidemia,  abnormal stress test in 2008 who presented to the hospital 09/05/2013 with chest pain, atrial fibrillation. converted to normal sinus rhythm en route,  stress test showed no ischemia,  echocardiogram showed normal ejection fraction with dilated left atrium He presents today for follow-up of his atrial fibrillation, paroxysmal  Left arm ache, numb Thinks it is how he holds his left arm, driving with it elevated  No significant  tachycardia,  He does report having one episode of palpitations lasting 10 min, 1 year ago Check pulse rate was 115 bpm, pulled out the metoprolol tartrate but did not take it Symptoms resolved relatively quickly  He lives alone, does not know if he still has sleep apnea No regular exercise program.  Recent labs reviewed total chol 180s  EKG on today's visit shows normal sinus rhythm with rate 52 bpm, no significant ST or T-wave changes  Other past medical history In 2015, was discharged on metoprolol but after reading the side effects, decided not to take the medication.  He feels that his initial episode of atrial fibrillation was secondary to significant gastric discomfort after overeating including eating a bloomin onion. He tends to overeat at the buffet  Echocardiogram 09/06/2013 showed normal ejection fraction, normal right ventricular systolic pressure, moderately dilated left atrium  Stress test dated 09/06/2013 showed no ischemia, normal study,  ejection fraction 70%  PMH:   has a past medical history of Hyperlipidemia, Kidney stones, Paroxysmal A-fib (HCC), and Paroxysmal A-fib (HCC).  PSH:    Past Surgical History:  Procedure Laterality Date  . LITHOTRIPSY     x 8    Current Outpatient Medications  Medication Sig Dispense Refill  . allopurinol (ZYLOPRIM) 100 MG tablet TAKE 1 TABLET (100 MG TOTAL) BY MOUTH DAILY.  3  . aspirin 81 MG tablet Take 81 mg by mouth daily.    Marland Kitchen. LYCOPENE PO Take by mouth.    . metoprolol succinate (TOPROL-XL) 25 MG 24 hr tablet Take 1 tablet (25 mg total) daily by mouth. 90 tablet 0  . metoprolol tartrate (LOPRESSOR) 25 MG tablet Take 1 tablet (25 mg total) by mouth 2 (two) times daily as needed. 60 tablet 3  . Multiple Vitamins-Minerals (CENTRUM SILVER ADULT 50+ PO) Take by mouth.    . Omega-3 Krill Oil 300 MG CAPS Take by mouth.    . ondansetron (ZOFRAN-ODT) 4 MG disintegrating tablet Take by mouth.    . oxyCODONE-acetaminophen (PERCOCET/ROXICET) 5-325 MG tablet Take by mouth.    . simvastatin (ZOCOR) 80 MG tablet TAKE 1 TABLET (80 MG TOTAL) BY MOUTH NIGHTLY.  1  . tamsulosin (FLOMAX) 0.4 MG CAPS capsule Take 0.4 mg by mouth as needed.      Current Facility-Administered Medications  Medication Dose Route Frequency Provider Last Rate Last Dose  . betamethasone acetate-betamethasone sodium phosphate (CELESTONE) injection 3 mg  3 mg Intramuscular Once Marcus Bryant, Marcus M, DPM      . betamethasone acetate-betamethasone sodium  phosphate (CELESTONE) injection 3 mg  3 mg Intramuscular Once Marcus Bryant, DPM         Allergies:   Patient has no known allergies.   Social History:  The patient  reports that  has never smoked. he has never used smokeless tobacco. He reports that he does not drink alcohol or use drugs.   Family History:   family history includes Heart attack (age of onset: 62) in his father; Hypertension in his mother.    Review of Systems: Review of Systems  Constitutional: Negative.    Respiratory: Negative.   Cardiovascular: Positive for palpitations.  Gastrointestinal: Negative.   Musculoskeletal: Negative.   Neurological: Negative.   Psychiatric/Behavioral: Negative.   All other systems reviewed and are negative.    PHYSICAL EXAM: VS:  BP (!) 143/84 (BP Location: Left Arm, Patient Position: Sitting, Cuff Size: Normal)   Pulse (!) 52   Ht 6' (1.829 m)   Wt 252 lb 8 oz (114.5 kg)   BMI 34.25 kg/m  , BMI Body mass index is 34.25 kg/m.  No significant change to physical exam as detailed below GEN: Well nourished, well developed, in no acute distress, obese  HEENT: normal  Neck: no JVD, carotid bruits, or masses Cardiac: RRR; no murmurs, rubs, or gallops,no edema  Respiratory:  clear to auscultation bilaterally, normal work of breathing GI: soft, nontender, nondistended, + BS MS: no deformity or atrophy  Skin: warm and dry, no rash Neuro:  Strength and sensation are intact Psych: euthymic mood, full affect    Recent Labs: No results found for requested labs within last 8760 hours.    Lipid Panel Lab Results  Component Value Date   CHOL 175 09/06/2013   HDL 29 (L) 09/06/2013   LDLCALC 117 (H) 09/06/2013   TRIG 145 09/06/2013      Wt Readings from Last 3 Encounters:  05/01/17 252 lb 8 oz (114.5 kg)  01/18/16 239 lb 8 oz (108.6 kg)  11/02/15 230 lb (104.3 kg)       ASSESSMENT AND PLAN:  Atrial fibrillation, unspecified type (HCC) -  Recommended he continue metoprolol succinate daily Tartrate as needed for palpitations Long discussion concerning monitoring heart rate using his pulse oximeter Detail the numbers he would obtain, what to watch for Recommended he call us if he starts to have increasing frequency of tachycardia events  Encounter for immunization - Plan: Flu Vaccine QUAD 36+ mos IM He received his flu shot today  Hyperlipidemia Cholesterol stable, mildly elevated, recommended he moderate his diet  Obesity  recommended low  carbohydrates including breads, potatoes  lives alone, eats out most of the time Recommended he try to cook at home  Obstructive sleep apnea  does not know if he snores Does not want to wear a CPAP Denies symptoms of significant daytime somnolence  Fiberglass splinter removed today in the office  Total encounter time more than 25 minutes  Greater than 50% was spent in counseling and coordination of care with the patient  Disposition:   F/U  12 months   Orders Placed This Encounter  Procedures  . EKG 12-Lead     Signed, Dossie Arbour, M.D., Ph.D. 05/01/2017  [facility] [place], [place] [phone]

## 2017-05-01 ENCOUNTER — Encounter: Payer: Self-pay | Admitting: Cardiovascular Disease

## 2017-05-01 ENCOUNTER — Ambulatory Visit: Payer: BLUE CROSS/BLUE SHIELD | Admitting: Cardiovascular Disease

## 2017-05-01 VITALS — BP 143/84 | HR 52 | Ht 72.0 in | Wt 252.5 lb

## 2017-05-01 DIAGNOSIS — E782 Mixed hyperlipidemia: Secondary | ICD-10-CM

## 2017-05-01 DIAGNOSIS — G4733 Obstructive sleep apnea (adult) (pediatric): Secondary | ICD-10-CM

## 2017-05-01 DIAGNOSIS — I480 Paroxysmal atrial fibrillation: Secondary | ICD-10-CM

## 2017-05-01 DIAGNOSIS — Z23 Encounter for immunization: Secondary | ICD-10-CM | POA: Diagnosis not present

## 2017-05-01 DIAGNOSIS — R079 Chest pain, unspecified: Secondary | ICD-10-CM | POA: Diagnosis not present

## 2017-05-01 MED ORDER — METOPROLOL SUCCINATE ER 25 MG PO TB24: 25.0000 mg | ORAL_TABLET | Freq: Every day | ORAL | 4 refills | Status: DC

## 2017-05-01 NOTE — Patient Instructions (Signed)

## 2018-03-18 ENCOUNTER — Telehealth: Payer: Self-pay | Admitting: Cardiovascular Disease

## 2018-03-18 NOTE — Telephone Encounter (Signed)
Patient c/o Palpitations:  High priority if patient c/o lightheadedness, shortness of breath, or chest pain  1) How long have you had palpitations/irregular HR/ Afib? Are you having the symptoms now? Yesterday morning around 4am, no symptoms now  2) Are you currently experiencing lightheadedness, SOB or CP? Yesterday he had pain in this chest  3) Do you have a history of afib (atrial fibrillation) or irregular heart rhythm? yes  4) Have you checked your BP or HR? (document readings if available): HR was 113 and went to 58  5) Are you experiencing any other symptoms? Just pain in his chest yesterday, none at the present.  Pt has an appt with Dr. Mariah MillingGollan tomorrow at 8:40 am

## 2018-03-18 NOTE — Telephone Encounter (Signed)
I attempted to call the patient. I left a message we were calling to follow up on his symptoms, but if he is feeling ok this afternoon, he does not have to call us back. He is scheduled to see Dr. Mariah MillingGollan tomorrow morning and we will see him then.

## 2018-03-19 ENCOUNTER — Encounter: Payer: Self-pay | Admitting: Cardiovascular Disease

## 2018-03-19 ENCOUNTER — Ambulatory Visit: Payer: BLUE CROSS/BLUE SHIELD | Admitting: Cardiovascular Disease

## 2018-03-19 VITALS — BP 132/79 | HR 59 | Ht 72.0 in | Wt 253.0 lb

## 2018-03-19 DIAGNOSIS — E782 Mixed hyperlipidemia: Secondary | ICD-10-CM

## 2018-03-19 DIAGNOSIS — G4733 Obstructive sleep apnea (adult) (pediatric): Secondary | ICD-10-CM

## 2018-03-19 DIAGNOSIS — I480 Paroxysmal atrial fibrillation: Secondary | ICD-10-CM

## 2018-03-19 DIAGNOSIS — R079 Chest pain, unspecified: Secondary | ICD-10-CM | POA: Diagnosis not present

## 2018-03-19 DIAGNOSIS — Z23 Encounter for immunization: Secondary | ICD-10-CM

## 2018-03-19 NOTE — Progress Notes (Signed)
Cardiology Office Note  Date:  03/19/2018   ID:  Bryant, Marcus 10-29-1955, MRN [id number]  PCP:  Marina Goodell, MD   Chief Complaint  Patient presents with  . other    CP and A-Fib. Medications reviewed verbally.    HPI:  [name] is a pleasant 62 year old gentleman with history of  snoring, obstructive sleep apnea, positive sleep study per the patient and 2006  does not wear CPAP,  kidney stones, frequent hyperlipidemia,  abnormal stress test in 2008 who presented to the hospital 09/05/2013 with chest pain, atrial fibrillation. converted to normal sinus rhythm en route,  stress test showed no ischemia,  echocardiogram showed normal ejection fraction with dilated left atrium He presents today for follow-up of his atrial fibrillation, paroxysmal  Arrhythmia on Sunday, 3rd time it has happened, consistent with atrial fibrillation Variable rate up to 117 bpm, level off around 60s, Lasted for 15 to 20 min  Does not wear CPAP, could not tolerate it Takes metoprolol in the evening  Atrial fib seem to happen more when having kidney stones or other pain Having some GERD sx  No regular exercise program  Recent labs reviewed total chol 180s  EKG personally reviewed by myself on todays visit shows normal sinus rhythm with rate 59 bpm, no significant ST or T-wave changes  Other past medical history In 2015, was discharged on metoprolol but after reading the side effects, decided not to take the medication.  He feels that his initial episode of atrial fibrillation was secondary to significant gastric discomfort after overeating including eating a bloomin onion. He tends to overeat at the buffet  Echocardiogram 09/06/2013 showed normal ejection fraction, normal right ventricular systolic pressure, moderately dilated left atrium  Stress test dated 09/06/2013 showed no ischemia, normal study, ejection fraction 70%  PMH:   has a past medical history of  Hyperlipidemia, Kidney stones, Paroxysmal A-fib (HCC), and Paroxysmal A-fib (HCC).  PSH:    Past Surgical History:  Procedure Laterality Date  . LITHOTRIPSY     x 8    Current Outpatient Medications  Medication Sig Dispense Refill  . aspirin 81 MG tablet Take 81 mg by mouth daily.    Marland Kitchen LYCOPENE PO Take by mouth.    . metoprolol succinate (TOPROL-XL) 25 MG 24 hr tablet Take 1 tablet (25 mg total) by mouth daily. 90 tablet 4  . metoprolol tartrate (LOPRESSOR) 25 MG tablet Take 1 tablet (25 mg total) by mouth 2 (two) times daily as needed. 60 tablet 3  . Multiple Vitamins-Minerals (CENTRUM SILVER ADULT 50+ PO) Take by mouth.    . Omega-3 Krill Oil 300 MG CAPS Take by mouth.    . simvastatin (ZOCOR) 80 MG tablet TAKE 1 TABLET (80 MG TOTAL) BY MOUTH NIGHTLY.  1  . tamsulosin (FLOMAX) 0.4 MG CAPS capsule Take 0.4 mg by mouth as needed.     Marland Kitchen allopurinol (ZYLOPRIM) 100 MG tablet TAKE 1 TABLET (100 MG TOTAL) BY MOUTH DAILY.  3  . ondansetron (ZOFRAN-ODT) 4 MG disintegrating tablet Take by mouth.    . oxyCODONE-acetaminophen (PERCOCET/ROXICET) 5-325 MG tablet Take by mouth.     Current Facility-Administered Medications  Medication Dose Route Frequency Provider Last Rate Last Dose  . betamethasone acetate-betamethasone sodium phosphate (CELESTONE) injection 3 mg  3 mg Intramuscular Once Gala Lewandowsky M, DPM      . betamethasone acetate-betamethasone sodium phosphate (CELESTONE) injection 3 mg  3 mg Intramuscular Once Felecia Shelling, DPM  Allergies:   Patient has no known allergies.   Social History:  The patient  reports that he has never smoked. He has never used smokeless tobacco. He reports that he does not drink alcohol or use drugs.   Family History:   family history includes Heart attack (age of onset: 3263) in his father; Hypertension in his mother.    Review of Systems: Review of Systems  Constitutional: Negative.   Respiratory: Negative.   Cardiovascular: Positive for  palpitations.       Tachycardia  Gastrointestinal: Negative.   Musculoskeletal: Negative.   Neurological: Negative.   Psychiatric/Behavioral: Negative.   All other systems reviewed and are negative.    PHYSICAL EXAM: VS:  BP 132/79 (BP Location: Left Arm, Patient Position: Sitting, Cuff Size: Normal)   Pulse (!) 59   Ht 6' (1.829 m)   Wt 253 lb (114.8 kg)   BMI 34.31 kg/m  , BMI Body mass index is 34.31 kg/m.  Constitutional:  oriented to person, place, and time. No distress.  HENT:  Head: Grossly normal Eyes:  no discharge. No scleral icterus.  Neck: No JVD, no carotid bruits  Cardiovascular: Regular rate and rhythm, no murmurs appreciated Pulmonary/Chest: Clear to auscultation bilaterally, no wheezes or rails Abdominal: Soft.  no distension.  no tenderness.  Musculoskeletal: Normal range of motion Neurological:  normal muscle tone. Coordination normal. No atrophy Skin: Skin warm and dry Psychiatric: normal affect, pleasant   Recent Labs: No results found for requested labs within last 8760 hours.    Lipid Panel Lab Results  Component Value Date   CHOL 175 09/06/2013   HDL 29 (L) 09/06/2013   LDLCALC 117 (H) 09/06/2013   TRIG 145 09/06/2013      Wt Readings from Last 3 Encounters:  03/19/18 253 lb (114.8 kg)  05/01/17 252 lb 8 oz (114.5 kg)  01/18/16 239 lb 8 oz (108.6 kg)       ASSESSMENT AND PLAN:  Atrial fibrillation, unspecified type (HCC) -  Continue metoprolol succinate 25 mg daily Discussed increasing the dose if he has more frequent episodes Tartrate as needed for palpitations Also discussed antiarrhythmics if he has frequent episodes Triggers likely include kidney pain, other stressors, sleep apnea which is poorly controlled Low chads VAsc discussed with him  Hyperlipidemia Recommended weight loss, walking program  Obstructive sleep apnea Unable to tolerate CPAP Suspect this is a trigger for his atrial fibrillation    Total encounter  time more than 25 minutes  Greater than 50% was spent in counseling and coordination of care with the patient  Disposition:   F/U  12 months   Orders Placed This Encounter  Procedures  . EKG 12-Lead     Signed, Dossie Arbourim , M.D., Ph.D. 03/19/2018  [facility] [place], [place] [phone]

## 2018-03-19 NOTE — Patient Instructions (Addendum)
Please call if you have more episodes of atrial fib   Medication Instructions:  No changes  If you need a refill on your cardiac medications before your next appointment, please call your pharmacy.    Lab work: No new labs needed   If you have labs (blood work) drawn today and your tests are completely normal, you will receive your results only by: Marland Kitchen. MyChart Message (if you have MyChart) OR . A paper copy in the mail If you have any lab test that is abnormal or we need to change your treatment, we will call you to review the results.   Testing/Procedures: No new testing needed   Follow-Up: At [facility], you and your health needs are our priority.  As part of our continuing mission to provide you with exceptional heart care, we have created designated Provider Care Teams.  These Care Teams include your primary Cardiologist (physician) and Advanced Practice Providers (APPs -  Physician Assistants and Nurse Practitioners) who all work together to provide you with the care you need, when you need it.  . You will need a follow up appointment in 12 months .   Please call our office 2 months in advance to schedule this appointment.    . Providers on your designated Care Team:   . Nicolasa Duckinghristopher Berge, NP . Eula Listenyan Dunn, PA-C . Marisue IvanJacquelyn Visser, PA-C  Any Other Special Instructions Will Be Listed Below (If Applicable).  For educational health videos Log in to : [email or web address] Or : [email or web address], password : [email or web address]

## 2018-05-03 ENCOUNTER — Ambulatory Visit: Payer: BLUE CROSS/BLUE SHIELD | Admitting: Podiatry

## 2018-05-03 ENCOUNTER — Ambulatory Visit (INDEPENDENT_AMBULATORY_CARE_PROVIDER_SITE_OTHER): Payer: BLUE CROSS/BLUE SHIELD

## 2018-05-03 ENCOUNTER — Encounter: Payer: Self-pay | Admitting: Podiatry

## 2018-05-03 DIAGNOSIS — M722 Plantar fascial fibromatosis: Secondary | ICD-10-CM

## 2018-05-03 DIAGNOSIS — M7662 Achilles tendinitis, left leg: Secondary | ICD-10-CM

## 2018-05-03 MED ORDER — DICLOFENAC SODIUM 75 MG PO TBEC: 75.0000 mg | DELAYED_RELEASE_TABLET | Freq: Two times a day (BID) | ORAL | 2 refills | Status: DC

## 2018-05-03 MED ORDER — TRIAMCINOLONE ACETONIDE 10 MG/ML IJ SUSP
10.0000 mg | Freq: Once | INTRAMUSCULAR | Status: AC
  Administered 2018-05-03: 10 mg

## 2018-05-03 NOTE — Patient Instructions (Signed)

## 2018-05-13 NOTE — Progress Notes (Signed)
05/14/2018  11:23 AM   Marcus Bryant [birth year] [id number]  Referring provider: Marina GoodellFeldpausch, Dale E, MD [address] [place], [place] [address]  Chief Complaint  Patient presents with  . Nephrolithiasis   HPI: Marcus Bryant is a 63 y.o. Caucasian male that presents today to establish care.  History of Stones He was previously followed by Dr. Assunta GamblesBrian Cope at [facility]. Last seen in their office on 02/14/2018 by Dr. Harlene Saltsavid Johnson for a kidney stone.   His last urologically relevant imaging on file is a KUB from 02/14/2018 for nephrolithiasis renal colic with multiple small calculi overlying the bilateral renal shadows (left > right, largest on left measuring 0.6 cm), calculi within pelvis likely resent phleboliths (unchanged from prior study).  He reports that he has had 13 surgeries in the past to assist in his stone passage (14-15 passages without intervention). He started passing stones at 20 and has been passing them for the past 42 years.  Last stone 04/17/2018, passed 4 stones at about 5 mm each, was unable to bring in stones for. Experienced pain starting 1-1.5 weeks prior. He has [facility].  Patient doesn't like to drink water, when he does he drinks it with Kool-aid powders, etc. He drinks clear sodas when he drinks soda and rarely drinks teas.  He was previously given a fluid pill (possibly HCTZ) which increased his frequency and due to his working conditions he stopped taking it.     Primarily uric acid stones on analysis as of 2018, most recent were calcium oxidate and calcium phosphate. Currently on allopurinol.  He reports that he had a 24 urine metabolic evaluation but never returned for the results.  He was told that he would be called with the results.  He has not heard anything.  Unable to locate these results in care everywhere today.  PMH: Past Medical History:  Diagnosis Date  . Hyperlipidemia   . Kidney stones   .  Paroxysmal A-fib (HCC)   . Paroxysmal A-fib [facility](HCC)     Surgical History: Past Surgical History:  Procedure Laterality Date  . LITHOTRIPSY     x 8   Home Medications:  Allergies as of 05/14/2018   No Known Allergies     Medication List       Accurate as of May 14, 2018 11:23 AM. Always use your most recent med list.        allopurinol 100 MG tablet Commonly known as:  ZYLOPRIM TAKE 1 TABLET (100 MG TOTAL) BY MOUTH DAILY.   aspirin 81 MG tablet Take 81 mg by mouth daily.   b complex vitamins capsule Take 1 capsule by mouth daily.   diclofenac 75 MG EC tablet Commonly known as:  VOLTAREN Take 1 tablet (75 mg total) by mouth 2 (two) times daily.   LYCOPENE PO Take by mouth.   metoprolol succinate 25 MG 24 hr tablet Commonly known as:  TOPROL-XL Take 1 tablet (25 mg total) by mouth daily.   metoprolol tartrate 25 MG tablet Commonly known as:  LOPRESSOR Take 1 tablet (25 mg total) by mouth 2 (two) times daily as needed.   Omega-3 Krill Oil 300 MG Caps Take by mouth.   ondansetron 4 MG disintegrating tablet Commonly known as:  ZOFRAN-ODT Take by mouth.   oxyCODONE-acetaminophen 5-325 MG tablet Commonly known as:  PERCOCET/ROXICET Take by mouth. As needed for kidney stones   simvastatin 40 MG  tablet Commonly known as:  ZOCOR Take 1 tablet (40 mg total) by mouth daily at 6 PM.   tamsulosin 0.4 MG Caps capsule Commonly known as:  FLOMAX Take 0.4 mg by mouth as needed.      Allergies: No Known Allergies  Family History: Family History  Problem Relation Age of Onset  . Hypertension Mother   . Heart attack Father 60    Social History:  reports that he has never smoked. He has never used smokeless tobacco. He reports that he does not drink alcohol or use drugs.  ROS: UROLOGY Frequent Urination?: No Hard to postpone urination?: No Burning/pain with urination?: No Get up at night to urinate?: Yes Leakage of urine?: No Urine stream starts and  stops?: No Trouble starting stream?: No Do you have to strain to urinate?: No Blood in urine?: No Urinary tract infection?: No Sexually transmitted disease?: No Injury to kidneys or bladder?: No Painful intercourse?: No Weak stream?: No Erection problems?: No Penile pain?: No  Gastrointestinal Nausea?: No Vomiting?: No Indigestion/heartburn?: No Diarrhea?: No Constipation?: No  Constitutional Fever: No Night sweats?: No Weight loss?: No Fatigue?: Yes  Skin Skin rash/lesions?: No Itching?: No  Eyes Blurred vision?: No Double vision?: No  Ears/Nose/Throat Sore throat?: No Sinus problems?: No  Hematologic/Lymphatic Swollen glands?: No Easy bruising?: No  Cardiovascular Leg swelling?: No Chest pain?: No  Respiratory Cough?: No Shortness of breath?: No  Endocrine Excessive thirst?: No  Musculoskeletal Back pain?: No Joint pain?: No  Neurological Headaches?: No Dizziness?: No  Psychologic Depression?: No Anxiety?: No  Physical Exam: BP 131/75 (BP Location: Left Arm, Patient Position: Sitting, Cuff Size: Normal)   Pulse 61   Ht 6' (1.829 m)   Wt 254 lb 3.2 oz (115.3 kg)   BMI 34.48 kg/m   Constitutional:  Alert and oriented, No acute distress. Respiratory: Normal respiratory effort, no increased work of breathing. GU: No CVA tenderness Skin: No rashes, bruises or suspicious lesions. Neurologic: Grossly intact, no focal deficits, moving all 4 extremities. Psychiatric: Normal mood and affect.  Laboratory Data: Lab Results  Component Value Date   WBC 7.2 09/06/2013   HGB 14.8 09/06/2013   HCT 42.6 09/06/2013   MCV 93 09/06/2013   PLT 210 09/06/2013   Creatinine 0.7 in 11/2017 PSA 2.3 on 04/2017  Lab Results  Component Value Date   HGBA1C 5.3 09/06/2013   Urinalysis See EPIC  Pertinent Imaging: Result Impression   Bilateral renal stones largest measuring 0.6 cm on the left.  Result Narrative  EXAM: XR ABDOMEN 1 VIEW DATE:  02/14/2018 10:07 AM ACCESSION: [id number] UN DICTATED: 02/14/2018 10:09 AM INTERPRETATION LOCATION: [facility]  CLINICAL INDICATION: 63 year old Male with N20.0 - Calculus of kidney   COMPARISON: 08/17/2017 KUB and renal ultrasound 10/26/2017  TECHNIQUE: Supine views of the abdomen.  FINDINGS:  There are multiple small calculi overlying the bilateral renal shadows greater in number on the left than right. Nonobstructive bowel gas pattern. Calculi within the pelvis likely represent phleboliths and are unchanged from prior study. No acute osseous abnormality.  Other Result Information  Interface, Rad Results In - 02/14/2018 11:05 AM EDT EXAM: XR ABDOMEN 1 VIEW DATE: 02/14/2018 10:07 AM ACCESSION: [id number] UN DICTATED: 02/14/2018 10:09 AM INTERPRETATION LOCATION: [facility]  CLINICAL INDICATION: 63 year old Male with N20.0 - Calculus of kidney    COMPARISON: 08/17/2017 KUB and renal ultrasound 10/26/2017  TECHNIQUE: Supine views of the abdomen.  FINDINGS:  There are multiple small calculi overlying the bilateral renal  shadows greater in number on the left than right. Nonobstructive bowel gas pattern. Calculi within the pelvis likely represent phleboliths and are unchanged from prior study. No acute osseous abnormality.  IMPRESSION:  Bilateral renal stones largest measuring 0.6 cm on the left.   I have personally reviewed the images today.  Assessment & Plan:    1. History of Stones  - His last documented stone was in 01/2018  - He presents that he passed 4 stones (~155mm each) on 04/17/2018  - [facility] and he said he will reach out to them and bring the results back             -He may benefit from additional pharmacotherapy for stone prevention, will review these results and recommend medication based on the results as deemed appropriate            -Continue to encourage adequate hydration/  reviewed stone diet  - KUB today to establish baseline  2.  Microscopic Hematuria  - 11-30 RBCs seen on UA today, may be related to stone disease  - Plan for cystoscopy consideration in the future if hematuria persist            -Recheck UA at next visit  Return in about 6 months (around 11/12/2018), or if symptoms worsen or fail to improve, for stone follow up with KUB prior.  Vanna ScotlandAshley Jerzy Roepke, MD [facility] [address], [address] [place], [place] [address] [phone]  I, Temidayo Atanda-Ogunleye , am acting as a scribe for Vanna ScotlandAshley Alyus Mofield, MD  I have reviewed the above documentation for accuracy and completeness, and I agree with the above.   Vanna ScotlandAshley Sharmin Foulk, MD

## 2018-05-14 ENCOUNTER — Ambulatory Visit: Payer: BLUE CROSS/BLUE SHIELD | Admitting: Urology

## 2018-05-14 ENCOUNTER — Ambulatory Visit
Admission: RE | Admit: 2018-05-14 | Discharge: 2018-05-14 | Disposition: A | Discharge status: Home / Self Care | Payer: BLUE CROSS/BLUE SHIELD | Source: Ambulatory Visit | Attending: Urology | Admitting: Urology

## 2018-05-14 ENCOUNTER — Encounter: Payer: Self-pay | Admitting: Urology

## 2018-05-14 ENCOUNTER — Telehealth: Payer: Self-pay | Admitting: Urology

## 2018-05-14 VITALS — BP 131/75 | HR 61 | Ht 72.0 in | Wt 254.2 lb

## 2018-05-14 DIAGNOSIS — N200 Calculus of kidney: Secondary | ICD-10-CM | POA: Insufficient documentation

## 2018-05-14 DIAGNOSIS — R3129 Other microscopic hematuria: Secondary | ICD-10-CM | POA: Diagnosis not present

## 2018-05-14 LAB — URINALYSIS, COMPLETE
Bilirubin, UA: NEGATIVE
Glucose, UA: NEGATIVE
Ketones, UA: NEGATIVE
Nitrite, UA: NEGATIVE
Protein, UA: NEGATIVE
Specific Gravity, UA: >1.03 — ABNORMAL HIGH (ref 1.005–1.030)
Urobilinogen, Ur: 0.2 mg/dL (ref 0.2–1.0)
pH, UA: 5.5 (ref 5.0–7.5)

## 2018-05-14 LAB — MICROSCOPIC EXAMINATION: EPITHELIAL CELLS (NON RENAL): NONE SEEN /HPF (ref 0–10)

## 2018-05-14 NOTE — Telephone Encounter (Signed)
KUB was reviewed with the patient by telephone today.  He is not complaining of any flank pain.  Given his history of a severely impacted right distal ureteral calculus, somewhat suspicious this may be extraluminal.  We discussed various options including follow-up with KUB in 2 weeks to reassess, pursuing CT scan or even possibly diagnostic ureteroscopy.  He like to come back in 2 weeks with a KUB.  We also had a lengthy discussion today about pain management.  He is asking for prophylactic prescription for Percocet which I declined.  He may call our office to get a prescription if he is having severe pain.  Vanna Scotland, MD

## 2018-05-15 NOTE — Telephone Encounter (Signed)
App made and patient is aware of app and KUB   Marcus Bryant

## 2018-05-20 ENCOUNTER — Other Ambulatory Visit: Payer: Self-pay | Admitting: Cardiovascular Disease

## 2018-05-21 ENCOUNTER — Encounter

## 2018-05-21 ENCOUNTER — Ambulatory Visit: Payer: BLUE CROSS/BLUE SHIELD | Admitting: Podiatry

## 2018-05-23 ENCOUNTER — Ambulatory Visit: Payer: BLUE CROSS/BLUE SHIELD | Admitting: Podiatry

## 2018-05-23 ENCOUNTER — Encounter: Payer: Self-pay | Admitting: Podiatry

## 2018-05-23 DIAGNOSIS — M7662 Achilles tendinitis, left leg: Secondary | ICD-10-CM | POA: Diagnosis not present

## 2018-05-23 DIAGNOSIS — M79676 Pain in unspecified toe(s): Secondary | ICD-10-CM

## 2018-05-23 NOTE — Progress Notes (Signed)
Subjective:   Patient ID: Marcus Bryant, male   DOB: 63 y.o.   MRN: [id number]   HPI Patient states the left foot is all of a sudden become tender and is making it hard for me to walk and be comfortable.  States that he had problems with the right one and trouble of the left one years ago but the right one was last time he had pain   ROS      Objective:  Physical Exam  Neurovascular status intact with posterior discomfort Achilles at the insertion into the calcaneus lateral side with no central or medial pain     Assessment:  Acute Achilles tendinitis left     Plan:  H&P x-ray reviewed and discussed injection explaining chances for rupture associated with injection.  Patient is willing to accept risk I did sterile prep just to the lateral side and injected the Achilles at its insertion with a combination of dexamethasone Kenalog 3 mg and Xylocaine 5 mg and advised on reduced activity ice therapy and will be seen back again in 3 weeks  X-rays indicate small spur with no indication of stress fracture arthritis

## 2018-05-24 NOTE — Progress Notes (Signed)
Subjective:   Patient ID: Marcus Bryant, male   DOB: 63 y.o.   MRN: [id number]   HPI Patient states he is improved but when he does too much it does hurt on the back of his heel and he wanted to get this looked at.  States overall he is very pleased   ROS      Objective:  Physical Exam  Neurovascular status intact with patient's left posterior lateral heel mildly inflamed but localized with no proximal problems and significant reduction of the edema associated with it     Assessment:  Improved Achilles tendon symptoms left with treatment     Plan:  Discussed with him the importance of ice therapy anti-inflammatories and supportive shoes along with heel lifts.  If symptoms persist will need to consider more aggressive treatment options

## 2018-05-28 ENCOUNTER — Ambulatory Visit: Payer: BLUE CROSS/BLUE SHIELD | Admitting: Urology

## 2018-06-06 ENCOUNTER — Ambulatory Visit (INDEPENDENT_AMBULATORY_CARE_PROVIDER_SITE_OTHER): Payer: BLUE CROSS/BLUE SHIELD | Admitting: Urology

## 2018-06-06 ENCOUNTER — Encounter: Payer: Self-pay | Admitting: Urology

## 2018-06-06 ENCOUNTER — Ambulatory Visit
Admission: RE | Admit: 2018-06-06 | Discharge: 2018-06-06 | Disposition: A | Discharge status: Home / Self Care | Payer: BLUE CROSS/BLUE SHIELD | Attending: Urology | Admitting: Urology

## 2018-06-06 ENCOUNTER — Ambulatory Visit
Admission: RE | Admit: 2018-06-06 | Discharge: 2018-06-06 | Disposition: A | Discharge status: Home / Self Care | Payer: BLUE CROSS/BLUE SHIELD | Source: Ambulatory Visit | Attending: Urology | Admitting: Urology

## 2018-06-06 VITALS — BP 114/75 | HR 105 | Ht 72.0 in | Wt 241.0 lb

## 2018-06-06 DIAGNOSIS — N200 Calculus of kidney: Secondary | ICD-10-CM

## 2018-06-06 DIAGNOSIS — R3129 Other microscopic hematuria: Secondary | ICD-10-CM

## 2018-06-06 LAB — MICROSCOPIC EXAMINATION
Bacteria, UA: NONE SEEN
Epithelial Cells (non renal): NONE SEEN /hpf (ref 0–10)
WBC, UA: NONE SEEN /hpf (ref 0–5)

## 2018-06-06 LAB — URINALYSIS, COMPLETE
BILIRUBIN UA: NEGATIVE
Glucose, UA: NEGATIVE
Ketones, UA: NEGATIVE
Nitrite, UA: NEGATIVE
SPEC GRAV UA: 1.02 (ref 1.005–1.030)
Urobilinogen, Ur: 0.2 mg/dL (ref 0.2–1.0)
pH, UA: 6 (ref 5.0–7.5)

## 2018-06-06 NOTE — Progress Notes (Addendum)
06/06/2018 5:11 PM   Marcus Bryant 1955-06-03 [id number]  Referring provider: Marina Goodell, MD [address] [place], [place] [address]  Chief Complaint  Patient presents with  . Calculus of kidney    HPI: Marcus Bryant is a 63 yo M who returns today for the evaluation and management of nephrolithiasis.  At the time of last visit, there is concern for right distal ureteral calculus.  He returns today with follow-up KUB.  Notably in the interim, he underwent appendectomy for ruptured appendix at [facility].  History of Stones He was previously followed by Dr. Assunta Gambles at [facility]  He reports that he has had 13 surgeries in the past to assist in his stone passage (14-15 passages without intervention). He started passing stones at 20 and has been passing them for the past 42 years.  He has had a 24hr urine at the end of last year at [facility].  Results unavailable to me, patient attempting to get the results of bring them to [facility] for review.  Primarily uric acid stones on analysis as of 2018, most recent were calcium oxidate and calcium phosphate. Currently on allopurinol.  At last visit, he had a KUB on 05/14/2018 with concern for possible right distal ureteral calculus in the setting of microscopic hematuria.  Is asymptomatic.  Reports several days after saying [place] in the office in this KUB, he passed 2 stones which he estimated were about 4 mm together in size.  He did not bring the stones.  He denies any further flank pain.  He had a ruptured appendicitis. CT scan from 05/25/2018 at [facility] showed no stones in ureter and biggest was 6 mm on left and 4 mm on right.  PMH: Past Medical History:  Diagnosis Date  . Hyperlipidemia   . Kidney stones   . Paroxysmal A-fib (HCC)   . Paroxysmal A-fib [facility])     Surgical History: Past Surgical History:  Procedure Laterality Date  . LITHOTRIPSY     x 8    Home Medications:  Allergies as of 06/06/2018   No Known Allergies       Medication List       Accurate as of June 06, 2018  5:11 PM. Always use your most recent med list.        allopurinol 100 MG tablet Commonly known as:  ZYLOPRIM TAKE 1 TABLET (100 MG TOTAL) BY MOUTH DAILY.   aspirin 81 MG tablet Take 81 mg by mouth daily.   b complex vitamins capsule Take 1 capsule by mouth daily.   diclofenac 75 MG EC tablet Commonly known as:  VOLTAREN Take 1 tablet (75 mg total) by mouth 2 (two) times daily.   LYCOPENE PO Take by mouth.   metoprolol succinate 25 MG 24 hr tablet Commonly known as:  TOPROL-XL TAKE 1 TABLET BY MOUTH EVERY DAY   metoprolol tartrate 25 MG tablet Commonly known as:  LOPRESSOR Take 1 tablet (25 mg total) by mouth 2 (two) times daily as needed.   metroNIDAZOLE 500 MG tablet Commonly known as:  FLAGYL Take by mouth.   Omega-3 Krill Oil 300 MG Caps Take by mouth.   ondansetron 4 MG disintegrating tablet Commonly known as:  ZOFRAN-ODT Take by mouth.   oxyCODONE 5 MG immediate release tablet Commonly known as:  Oxy IR/ROXICODONE   oxyCODONE-acetaminophen 5-325 MG tablet Commonly known as:  PERCOCET/ROXICET Take by mouth. As needed for kidney stones   simvastatin 40 MG tablet  Commonly known as:  ZOCOR Take 1 tablet (40 mg total) by mouth daily at 6 PM.   tamsulosin 0.4 MG Caps capsule Commonly known as:  FLOMAX Take 0.4 mg by mouth as needed.       Allergies: No Known Allergies  Family History: Family History  Problem Relation Age of Onset  . Hypertension Mother   . Heart attack Father 70    Social History:  reports that he has never smoked. He has never used smokeless tobacco. He reports that he does not drink alcohol or use drugs.  ROS: UROLOGY Frequent Urination?: Yes Hard to postpone urination?: Yes Burning/pain with urination?: No Get up at night to urinate?: No Leakage of urine?: Yes Urine stream starts and stops?: No Trouble starting stream?: No Do you have to strain to urinate?:  No Blood in urine?: Yes Urinary tract infection?: No Sexually transmitted disease?: No Injury to kidneys or bladder?: No Painful intercourse?: No Weak stream?: No Erection problems?: No Penile pain?: No  Gastrointestinal Nausea?: No Vomiting?: No Indigestion/heartburn?: No Diarrhea?: No Constipation?: No  Constitutional Fever: No Night sweats?: No Weight loss?: No Fatigue?: No  Skin Skin rash/lesions?: No Itching?: No  Eyes Blurred vision?: No Double vision?: No  Ears/Nose/Throat Sore throat?: No Sinus problems?: No  Hematologic/Lymphatic Swollen glands?: No Easy bruising?: Yes  Cardiovascular Leg swelling?: No Chest pain?: No  Respiratory Cough?: Yes Shortness of breath?: No  Endocrine Excessive thirst?: No  Musculoskeletal Back pain?: Yes Joint pain?: No  Neurological Headaches?: No Dizziness?: No  Psychologic Depression?: No Anxiety?: No  Physical Exam: BP 114/75 (BP Location: Left Arm, Patient Position: Sitting)   Pulse (!) 105   Ht 6' (1.829 m)   Wt 241 lb (109.3 kg)   BMI 32.69 kg/m   Constitutional:  Alert and oriented, No acute distress. HEENT: [place] AT, moist mucus membranes.  Trachea midline, no masses. Cardiovascular: No clubbing, cyanosis, or edema. Respiratory: Normal respiratory effort, no increased work of breathing. GI: Abdomen is soft, nontender, nondistended, no abdominal masses Skin: No rashes, bruises or suspicious lesions. Neurologic: Grossly intact, no focal deficits, moving all 4 extremities. Psychiatric: Normal mood and affect.  Pertinent Imaging: I personally reviewed his KUB today, awaiting final read by radiology.  In comparison to his previous KUB, no longer see the right distal ureteral calculus is previously noted.  There are bilateral nonobstructing stones up to 6 mm on the left, 4 on the right.  CT scan from care everywhere of the abdomen pelvis was also reviewed and correlates with above KUB findings.  It  is of ureteral calculus on the study.  Assessment & Plan:    1. Bialteral kidney stones He had a 24-hour urine study at [facility] and he said he will reach out to them and bring the results back He may benefit from additional pharmacotherapy for stone prevention, will review these results and recommend medication based on the results as deemed appropriate Continue to encourage adequate hydration/reviewed stone diet CT scan from 05/25/2018 showed no stones in ureter and biggest was 6 mm on left and 4 mm on right, intervally passed right distal ureteral calculus without much difficulty Return in about 6 months for KUB   2. Microscopic hematuria Microscopic hematuria persists today although recently passed another ureteral calculus with nonobstructing stones Patient has had multiple endoscopic procedures in the recent past without concern for underlying malignancy We will continue to follow, likely related to stone disease   Return in about 6 months (around 12/05/2018)  for KUB.   [facility] [address], [address] [place], [place] [address] [phone]  I, Donne Hazelethusan Sivanesan, am acting as a scribe for Dr. Vanna ScotlandAshley Jameria Bradway,  I have reviewed the above documentation for accuracy and completeness, and I agree with the above.   Vanna ScotlandAshley Lilyian Quayle, MD

## 2018-06-14 ENCOUNTER — Ambulatory Visit: Payer: BLUE CROSS/BLUE SHIELD | Admitting: Podiatry

## 2018-06-20 ENCOUNTER — Telehealth: Payer: Self-pay | Admitting: Urology

## 2018-06-20 NOTE — Telephone Encounter (Signed)
Pt called office stating he was just seen in our office 2/13 with an xray. Since then the pt was admitted for ruptured appendix and has passed a 4.7mm stone 09/04/22 morning, then another stone, Weds the size of 5.26mm, pt states his surgeon advised him to share this information with Dr. Apolinar Junes. Pt also states he is out of pain medication while passing the stones. Please advise. Thank you. [phone]. Pt states he is fine now, just relaying info per recommendation.

## 2018-09-12 ENCOUNTER — Ambulatory Visit
Admission: RE | Admit: 2018-09-12 | Discharge: 2018-09-12 | Disposition: A | Discharge status: Home / Self Care | Payer: PRIVATE HEALTH INSURANCE | Attending: Urology | Admitting: Urology

## 2018-09-12 ENCOUNTER — Telehealth (INDEPENDENT_AMBULATORY_CARE_PROVIDER_SITE_OTHER): Payer: PRIVATE HEALTH INSURANCE | Admitting: Urology

## 2018-09-12 ENCOUNTER — Ambulatory Visit
Admission: RE | Admit: 2018-09-12 | Discharge: 2018-09-12 | Disposition: A | Discharge status: Home / Self Care | Payer: PRIVATE HEALTH INSURANCE | Source: Ambulatory Visit | Attending: Urology | Admitting: Urology

## 2018-09-12 ENCOUNTER — Other Ambulatory Visit: Payer: Self-pay

## 2018-09-12 ENCOUNTER — Telehealth: Payer: Self-pay | Admitting: Urology

## 2018-09-12 DIAGNOSIS — N200 Calculus of kidney: Secondary | ICD-10-CM

## 2018-09-12 DIAGNOSIS — Z87442 Personal history of urinary calculi: Secondary | ICD-10-CM

## 2018-09-12 DIAGNOSIS — R310 Gross hematuria: Secondary | ICD-10-CM

## 2018-09-12 DIAGNOSIS — R1032 Left lower quadrant pain: Secondary | ICD-10-CM

## 2018-09-12 DIAGNOSIS — N3001 Acute cystitis with hematuria: Secondary | ICD-10-CM

## 2018-09-12 LAB — URINALYSIS, COMPLETE
Bilirubin, UA: NEGATIVE
Glucose, UA: NEGATIVE
Ketones, UA: NEGATIVE
Nitrite, UA: NEGATIVE
Specific Gravity, UA: 1.025 (ref 1.005–1.030)
Urobilinogen, Ur: 0.2 mg/dL (ref 0.2–1.0)
pH, UA: 5.5 (ref 5.0–7.5)

## 2018-09-12 LAB — MICROSCOPIC EXAMINATION
Epithelial Cells (non renal): NONE SEEN /hpf (ref 0–10)
RBC, Urine: >30 /hpf — AB (ref 0–2)

## 2018-09-12 MED ORDER — TAMSULOSIN HCL 0.4 MG PO CAPS: 0.4000 mg | ORAL_CAPSULE | Freq: Every day | ORAL | 0 refills | Status: DC

## 2018-09-12 MED ORDER — OXYCODONE-ACETAMINOPHEN 5-325 MG PO TABS: 1.0000 | ORAL_TABLET | ORAL | 0 refills | Status: DC | PRN

## 2018-09-12 MED ORDER — OXYCODONE-ACETAMINOPHEN 5-325 MG PO TABS: 1.0000 | ORAL_TABLET | ORAL | 0 refills | Status: AC | PRN

## 2018-09-12 MED ORDER — SULFAMETHOXAZOLE-TRIMETHOPRIM 800-160 MG PO TABS: 1.0000 | ORAL_TABLET | Freq: Two times a day (BID) | ORAL | 0 refills | Status: DC

## 2018-09-12 NOTE — Telephone Encounter (Signed)
Pt didn't show up to leave urine sample.

## 2018-09-12 NOTE — Telephone Encounter (Signed)
Patient arrived at the clinic today and submitted a urine sample for urinalysis.  He also had a KUB.  A virtual visit with Dr. Apolinar Junes was scheduled for this afternoon.

## 2018-09-12 NOTE — Telephone Encounter (Signed)
Have his go get xray (there is already an outstanding order) and drop of urine for UCx/ UA.  Depending of what that shows, we can offer him a virtual visit if needed today.  Vanna Scotland, MD

## 2018-09-12 NOTE — Progress Notes (Signed)
Virtual Visit via Telephone Note  I connected with Marcus Bryant on 09/12/18 at  4:15 PM EDT by telephone and verified that I am speaking with the correct person using two identifiers.  Location: Patient: home Provider: office   I discussed the limitations, risks, security and privacy concerns of performing an evaluation and management service by telephone and the availability of in person appointments. I also discussed with the patient that there may be a patient responsible charge related to this service. The patient expressed understanding and agreed to proceed.   History of Present Illness: 63 year old male with a personal history of kidney stones who presents today via virtual telephone visit to discuss his left lower quadrant pain.  He reports that approximately week ago, he developed painless gross hematuria which has since subsided.  On Tuesday, he developed left lower abdominal pain which was severe associated with nausea and vomiting and chills.  This comes and goes.  Is exacerbated with bending over.  He has had very little p.o. tolerance, only able to drink a few Cokes over the last several days.  He denies any overt fevers but he does not have a working thermometer.  Today during our telephone visit, he did use both a digital thermometer which was not functioning followed by a traditional mercury thermometer which was not reading properly.  He is unsure whether he had true fevers.  He denies any dysuria, urgency, frequency from baseline.  Notably, he does report that he has a history of perforated appendicitis which is not able to remove due to infection.  He is worried that his pain may be related to this rather than stones although it is consistent with his previous stone episodes and he does have gross hematuria.  Further evaluation today with KUB reveals multiple phleboliths in the pelvis which are unchanged from previous KUBs.  He has bilateral nonobstructing stones.  There  is no overt ureteral stones on KUB today which is personally reviewed.  In addition to this, he did stop by our office to drop off a urine.  This showed greater than 30 red blood cells per high-powered field as well as 6-10 white blood cells per high-power field and moderate bacteria.  Nitrate negative.   Observations/Objective: Calm, interactive, in no acute distress on telephone conversation today  Assessment and Plan:  1. Left lower quadrant abdominal pain Differential diagnosis of left lower quadrant pain discussed in detail  Given his personal history of kidney stones, gross hematuria and recurrent stone disease, this most likely represents a stone episode, however, differential diagnosis includes bowel obstruction, intra-abdominal abscess, amongst others.  At this point time, I recommend that if his pain becomes severe he should go to the emergency room for CT scan.  Stone is not seen on KUB.  I strongly recommended that if his pain fails to resolve in the next 1 to 2 days, we should pursue noncontrast CT scan.  He is agreeable this plan and let us know.  In the interim, I did call in some pain medications for him.  If he requires a refill, will need to pursue imaging to confirm the diagnosis.  2. Gross hematuria Cystitis vs. Stone as above  3. History of kidney stones As above  4. Acute cystitis with hematuria Bactrim DS twice daily for 7 days for presumed cystitis  Follow Up Instructions: As scheduled or sooner as needed   I discussed the assessment and treatment plan with the patient. The patient was provided an  opportunity to ask questions and all were answered. The patient agreed with the plan and demonstrated an understanding of the instructions.   The patient was advised to call back or seek an in-person evaluation if the symptoms worsen or if the condition fails to improve as anticipated.  I provided 15 minutes of non-face-to-face time during this  encounter.   Vanna ScotlandAshley Buena Boehm, MD

## 2018-09-12 NOTE — Telephone Encounter (Signed)
Patient is calling the office today with complaint of possible kidney stone.  He is having gross hematuria and left sided abdominal pain.    Note:  He has had trouble with his appendix and has a hernia.  He is requesting an order for an xray and an appointment.  Please advise on how to proceed.

## 2018-09-14 LAB — CULTURE, URINE COMPREHENSIVE

## 2018-09-18 ENCOUNTER — Other Ambulatory Visit: Payer: Self-pay

## 2018-09-18 MED ORDER — TAMSULOSIN HCL 0.4 MG PO CAPS: 0.4000 mg | ORAL_CAPSULE | Freq: Every day | ORAL | 3 refills | Status: DC

## 2018-09-18 NOTE — Telephone Encounter (Signed)
Pt requesting long term rx for Flomax, 90 day supply. I did find remote BPH history in pt's care everywhere noted in 2016. Please advise

## 2018-10-02 ENCOUNTER — Telehealth: Payer: Self-pay | Admitting: Urology

## 2018-10-02 NOTE — Telephone Encounter (Signed)
Pt called office and states he had KUB on 5/21.  He passed 13 stones ranging from 81mm-4mm and 1 that was a little over 19mm.  He wanted to make [place] aware.

## 2018-11-01 ENCOUNTER — Telehealth: Payer: Self-pay | Admitting: Urology

## 2018-11-01 NOTE — Telephone Encounter (Signed)
Last encounter makes no mention of needing KUB. Please advise if one is neccessary before pt's upcoming appt.

## 2018-11-01 NOTE — Telephone Encounter (Signed)
Returned call to patient and advised. He requested sooner appt with Dr Erlene Quan. Appt made. Patient satisfied and verbalized understanding and confirmed new appt.

## 2018-11-01 NOTE — Telephone Encounter (Signed)
Were not really ever able to see his ureteral stones on KUB.  Lets hold off and just see him first before we order anything or have him get any imaging.  Hollice Espy, MD

## 2018-11-01 NOTE — Telephone Encounter (Signed)
Pt has an appt on 11/12/2018 and he wants to know if he needs to do a KUB prior to appt, he states that he is passing kidney stones now. Please advise.

## 2018-11-05 ENCOUNTER — Encounter: Payer: Self-pay | Admitting: Urology

## 2018-11-05 ENCOUNTER — Ambulatory Visit (INDEPENDENT_AMBULATORY_CARE_PROVIDER_SITE_OTHER): Payer: PRIVATE HEALTH INSURANCE | Admitting: Urology

## 2018-11-05 ENCOUNTER — Other Ambulatory Visit: Payer: Self-pay

## 2018-11-05 VITALS — BP 164/81 | HR 74 | Ht 72.0 in | Wt 239.0 lb

## 2018-11-05 DIAGNOSIS — R109 Unspecified abdominal pain: Secondary | ICD-10-CM | POA: Diagnosis not present

## 2018-11-05 DIAGNOSIS — N200 Calculus of kidney: Secondary | ICD-10-CM

## 2018-11-05 NOTE — Progress Notes (Signed)
11/05/2018 11:15 AM   Rutherford GuysWilliam W Brokaw [birth year] [id number]  Referring provider: Marina GoodellFeldpausch, Dale E, MD [address] [place],  [place] [id number]  Chief Complaint  Patient presents with  . Nephrolithiasis    HPI: 63 year old male with a personal history of nephrolithiasis who returns today for follow-up.  He reports that about a month ago, he passed about 8 stones.  He brings in with him today.  One is measuring up to 7 mm, the remainder 1 to 2 mm each.  He continues to complain of abdominal pain primarily located in the lower abdomen and near the umbilicus.  This is associated with nausea without vomiting.  He is also been constipated.  He is returning to see his surgeon as he feels like this may be related to his appendectomy surgery and that he may have a hernia.  We were never able to track down his 24-hour urine results from last year.  He reports that in the past the stones have primarily been calcium oxalate but at one point they were also uric acid.  He reports that Dr. Achilles Dunkope had him on allopurinol for several years and he did not have any stones during that time but was taken off of that more recently.  He again today is asking for narcotics as prophylaxis in case he has a stone episode.  He is not having pain today.   PMH: Past Medical History:  Diagnosis Date  . Hyperlipidemia   . Kidney stones   . Paroxysmal A-fib (HCC)   . Paroxysmal A-fib [facility](HCC)     Surgical History: Past Surgical History:  Procedure Laterality Date  . LITHOTRIPSY     x 8    Home Medications:  Allergies as of 11/05/2018   No Known Allergies     Medication List       Accurate as of November 05, 2018 11:59 PM. If you have any questions, ask your nurse or doctor.        STOP taking these medications   allopurinol 100 MG tablet Commonly known as: ZYLOPRIM Stopped by: Vanna ScotlandAshley Eevie Lapp, MD     TAKE these medications   aspirin 81 MG tablet Take 81 mg by mouth daily.   b complex  vitamins capsule Take 1 capsule by mouth daily.   diclofenac 75 MG EC tablet Commonly known as: VOLTAREN Take 1 tablet (75 mg total) by mouth 2 (two) times daily.   LYCOPENE PO Take by mouth.   metoprolol succinate 25 MG 24 hr tablet Commonly known as: TOPROL-XL TAKE 1 TABLET BY MOUTH EVERY DAY   metoprolol tartrate 25 MG tablet Commonly known as: LOPRESSOR Take 1 tablet (25 mg total) by mouth 2 (two) times daily as needed.   Omega-3 Krill Oil 300 MG Caps Take by mouth.   ondansetron 4 MG disintegrating tablet Commonly known as: ZOFRAN-ODT Take by mouth.   oxyCODONE 5 MG immediate release tablet Commonly known as: Oxy IR/ROXICODONE   oxyCODONE-acetaminophen 5-325 MG tablet Commonly known as: PERCOCET/ROXICET Take by mouth. As needed for kidney stones What changed: Another medication with the same name was removed. Continue taking this medication, and follow the directions you see here. Changed by: Vanna ScotlandAshley Carvel Huskins, MD   simvastatin 40 MG tablet Commonly known as: ZOCOR Take 1 tablet (40 mg total) by mouth daily at 6 PM.   sulfamethoxazole-trimethoprim 800-160 MG tablet Commonly known as: BACTRIM DS Take 1 tablet by mouth every 12 (twelve) hours.   tamsulosin 0.4 MG Caps capsule  Commonly known as: Flomax Take 1 capsule (0.4 mg total) by mouth daily.       Allergies: No Known Allergies  Family History: Family History  Problem Relation Age of Onset  . Hypertension Mother   . Heart attack Father 34    Social History:  reports that he has never smoked. He has never used smokeless tobacco. He reports that he does not drink alcohol or use drugs.  ROS: UROLOGY Frequent Urination?: No Hard to postpone urination?: No Burning/pain with urination?: No Get up at night to urinate?: No Leakage of urine?: No Urine stream starts and stops?: No Trouble starting stream?: No Do you have to strain to urinate?: No Blood in urine?: No Urinary tract infection?: No  Sexually transmitted disease?: No Injury to kidneys or bladder?: No Painful intercourse?: No Weak stream?: No Erection problems?: No Penile pain?: No  Gastrointestinal Nausea?: No Vomiting?: No Indigestion/heartburn?: No Diarrhea?: No Constipation?: No  Constitutional Fever: No Night sweats?: No Weight loss?: No Fatigue?: No  Skin Skin rash/lesions?: No Itching?: No  Eyes Blurred vision?: No Double vision?: No  Ears/Nose/Throat Sore throat?: No Sinus problems?: No  Hematologic/Lymphatic Swollen glands?: No Easy bruising?: No  Cardiovascular Leg swelling?: No Chest pain?: No  Respiratory Cough?: No Shortness of breath?: No  Endocrine Excessive thirst?: No  Musculoskeletal Back pain?: No Joint pain?: No  Neurological Headaches?: No Dizziness?: No  Psychologic Depression?: No Anxiety?: No  Physical Exam: BP (!) 164/81   Pulse 74   Ht 6' (1.829 m)   Wt 239 lb (108.4 kg)   BMI 32.41 kg/m   Constitutional:  Alert and oriented, No acute distress. HEENT: [place] AT, moist mucus membranes.  Trachea midline, no masses. Cardiovascular: No clubbing, cyanosis, or edema. Respiratory: Normal respiratory effort, no increased work of breathing. GI: Abdomen is soft, nontender, nondistended, no abdominal masses. Skin: No rashes, bruises or suspicious lesions. Neurologic: Grossly intact, no focal deficits, moving all 4 extremities. Psychiatric: Normal mood and affect.  Laboratory Data: Lab Results  Component Value Date   WBC 7.2 09/06/2013   HGB 14.8 09/06/2013   HCT 42.6 09/06/2013   MCV 93 09/06/2013   PLT 210 09/06/2013    Lab Results  Component Value Date   CREATININE 0.62 09/06/2013     Lab Results  Component Value Date   HGBA1C 5.3 09/06/2013   Stones personally visualized today, multiple punctate stones along with a 7 mm stone role seen.  Pertinent Imaging: Results for orders placed during the hospital encounter of 09/12/18  Abdomen 1  view (KUB)   Narrative CLINICAL DATA:  History of kidney stones left-sided abdominal pain  EXAM: ABDOMEN - 1 VIEW  COMPARISON:  06/06/2018, 05/14/2018  FINDINGS: Nonobstructed bowel-gas pattern. Multiple small stones overlying the kidneys. Rounded calcifications in the pelvis without change, consistent with phleboliths and possible prostate calcification.  IMPRESSION: Small kidney stones bilaterally. Pelvic calcifications similar to most recent prior radiograph.   Electronically Signed   By: Donavan Foil M.D.   On: 09/12/2018 15:07     Assessment & Plan:    1. Recurrent nephrolithiasis Numerous bilateral stones, has passed a number of stones since last visit which he brings with him today  We will send stones again for stone analysis, patient reports a history of both uric acid as well as calcium oxalate stones.  Was previously on allopurinol.  We reviewed stone diet in detail.  I have strongly encouraged him to repeat his 24 metabolic analysis with consideration of pharmacotherapy  depending on the results given his frequency of stone episodes.  In addition to the above, if he has signs or symptoms of passing a stone, would like to repeat a noncontrast CT scan as his ureteral stones are not clearly seen on KUB.  This will help us to get a better idea of the size of number of his overall stone burden.  He is agreeable this plan.  Narcotics discussion as below.  2. Flank pain Patient continues to adamantly request prescription for narcotics today despite not being in pain on this occasion.  He reports that when he has the episodes of pain, his pain is severe, debilitating, and he cannot drive to go get pain medicines or come to the CT scan.  As per previous discussions, I am unwilling to fill a pain prescription prophylactically in case he has pain.  If he is acutely passing a stone, pain medicine could be prescribed at that time.  He was advised to take a dose 800 mg of  ibuprofen x1 if he has acute stone pain and then call us for further instruction.  He is not satisfied with this.  He was offered a referral for second opinion, states his previous urologist used to prescribe pain meds in advance of stone episodes.     Return in about 3 months (around 02/05/2019) for 24 hour urine results.  Vanna ScotlandAshley Ebrima Ranta, MD  [facility] [address], [address] [place], [place] [address] [phone]

## 2018-11-05 NOTE — Patient Instructions (Signed)

## 2018-11-12 ENCOUNTER — Ambulatory Visit: Payer: PRIVATE HEALTH INSURANCE | Admitting: Urology

## 2018-11-13 ENCOUNTER — Other Ambulatory Visit: Payer: Self-pay | Admitting: Urology

## 2018-11-14 ENCOUNTER — Ambulatory Visit: Payer: BLUE CROSS/BLUE SHIELD | Admitting: Urology

## 2019-02-10 ENCOUNTER — Telehealth: Payer: Self-pay | Admitting: *Deleted

## 2019-02-10 NOTE — Telephone Encounter (Addendum)
Left VM to return call, need to reschedule appointment. Litholink not completed.

## 2019-02-11 ENCOUNTER — Encounter: Payer: Self-pay | Admitting: Urology

## 2019-02-11 ENCOUNTER — Ambulatory Visit: Payer: PRIVATE HEALTH INSURANCE | Admitting: Urology

## 2019-02-11 NOTE — Progress Notes (Deleted)
02/11/2019 8:28 AM   Rutherford Guys Bendix [birth year] [id number]  Referring provider: Marina Goodell, MD [address] [place],  [place] [address]  No chief complaint on file.   HPI:    PMH: Past Medical History:  Diagnosis Date  . Hyperlipidemia   . Kidney stones   . Paroxysmal A-fib (HCC)   . Paroxysmal A-fib [facility])     Surgical History: Past Surgical History:  Procedure Laterality Date  . LITHOTRIPSY     x 8    Home Medications:  Allergies as of 02/11/2019   No Known Allergies     Medication List       Accurate as of February 11, 2019  8:28 AM. If you have any questions, ask your nurse or doctor.        aspirin 81 MG tablet Take 81 mg by mouth daily.   b complex vitamins capsule Take 1 capsule by mouth daily.   diclofenac 75 MG EC tablet Commonly known as: VOLTAREN Take 1 tablet (75 mg total) by mouth 2 (two) times daily.   LYCOPENE PO Take by mouth.   metoprolol succinate 25 MG 24 hr tablet Commonly known as: TOPROL-XL TAKE 1 TABLET BY MOUTH EVERY DAY   metoprolol tartrate 25 MG tablet Commonly known as: LOPRESSOR Take 1 tablet (25 mg total) by mouth 2 (two) times daily as needed.   Omega-3 Krill Oil 300 MG Caps Take by mouth.   ondansetron 4 MG disintegrating tablet Commonly known as: ZOFRAN-ODT Take by mouth.   oxyCODONE 5 MG immediate release tablet Commonly known as: Oxy IR/ROXICODONE   oxyCODONE-acetaminophen 5-325 MG tablet Commonly known as: PERCOCET/ROXICET Take by mouth. As needed for kidney stones   simvastatin 40 MG tablet Commonly known as: ZOCOR Take 1 tablet (40 mg total) by mouth daily at 6 PM.   sulfamethoxazole-trimethoprim 800-160 MG tablet Commonly known as: BACTRIM DS Take 1 tablet by mouth every 12 (twelve) hours.   tamsulosin 0.4 MG Caps capsule Commonly known as: Flomax Take 1 capsule (0.4 mg total) by mouth daily.       Allergies: No Known Allergies  Family History: Family History  Problem  Relation Age of Onset  . Hypertension Mother   . Heart attack Father 3    Social History:  reports that he has never smoked. He has never used smokeless tobacco. He reports that he does not drink alcohol or use drugs.  ROS:                                        Physical Exam: There were no vitals taken for this visit.  Constitutional:  Alert and oriented, No acute distress. HEENT: [place] AT, moist mucus membranes.  Trachea midline, no masses. Cardiovascular: No clubbing, cyanosis, or edema. Respiratory: Normal respiratory effort, no increased work of breathing. GI: Abdomen is soft, nontender, nondistended, no abdominal masses GU: No CVA tenderness Lymph: No cervical or inguinal lymphadenopathy. Skin: No rashes, bruises or suspicious lesions. Neurologic: Grossly intact, no focal deficits, moving all 4 extremities. Psychiatric: Normal mood and affect.  Laboratory Data: Lab Results  Component Value Date   WBC 7.2 09/06/2013   HGB 14.8 09/06/2013   HCT 42.6 09/06/2013   MCV 93 09/06/2013   PLT 210 09/06/2013    Lab Results  Component Value Date   CREATININE 0.62 09/06/2013    No results found for: PSA  No results found for: TESTOSTERONE  Lab Results  Component Value Date   HGBA1C 5.3 09/06/2013    Urinalysis    Component Value Date/Time   APPEARANCEUR Hazy (A) 09/12/2018 1529   GLUCOSEU Negative 09/12/2018 1529   BILIRUBINUR Negative 09/12/2018 1529   PROTEINUR 2+ (A) 09/12/2018 1529   NITRITE Negative 09/12/2018 1529   LEUKOCYTESUR Trace (A) 09/12/2018 1529    Lab Results  Component Value Date   LABMICR See below: 09/12/2018   WBCUA 6-10 (A) 09/12/2018   RBCUA 3-10 (A) 06/06/2018   LABEPIT None seen 09/12/2018   MUCUS Present (A) 06/06/2018   BACTERIA Few 09/12/2018    Pertinent Imaging: *** Results for orders placed during the hospital encounter of 09/12/18  Abdomen 1 view (KUB)   Narrative CLINICAL DATA:  History of kidney  stones left-sided abdominal pain  EXAM: ABDOMEN - 1 VIEW  COMPARISON:  06/06/2018, 05/14/2018  FINDINGS: Nonobstructed bowel-gas pattern. Multiple small stones overlying the kidneys. Rounded calcifications in the pelvis without change, consistent with phleboliths and possible prostate calcification.  IMPRESSION: Small kidney stones bilaterally. Pelvic calcifications similar to most recent prior radiograph.   Electronically Signed   By: Donavan Foil M.D.   On: 09/12/2018 15:07    No results found for this or any previous visit. No results found for this or any previous visit. No results found for this or any previous visit. No results found for this or any previous visit. No results found for this or any previous visit. No results found for this or any previous visit. No results found for this or any previous visit.  Assessment & Plan:    There are no diagnoses linked to this encounter.  No follow-ups on file.  Hollice Espy, MD  [facility] [address] [place], [place] [address] [phone]

## 2019-02-20 ENCOUNTER — Other Ambulatory Visit: Payer: Self-pay | Admitting: Cardiovascular Disease

## 2019-05-20 ENCOUNTER — Other Ambulatory Visit: Payer: Self-pay | Admitting: Cardiovascular Disease

## 2019-05-20 NOTE — Progress Notes (Signed)
Cardiology Office Note  Date:  05/21/2019   ID:  Marcus Bryant, Marcus Bryant 03-22-1956, MRN [id number]  PCP:  Marina Goodell, MD   Chief Complaint  Patient presents with  . other    12 month f/u no complaints today. Meds reviewed verbally with pt.    HPI:  Mr. Monette is a pleasant 64 year old gentleman with history of  snoring, obstructive sleep apnea, positive sleep study per the patient and 2006  does not wear CPAP,  kidney stones, frequent hyperlipidemia,  abnormal stress test in 2008 who presented to the hospital 09/05/2013 with chest pain, atrial fibrillation. converted to normal sinus rhythm en route,  stress test showed no ischemia, CHADS VASC 0  echocardiogram showed normal ejection fraction with dilated left atrium He presents today for follow-up of his atrial fibrillation, paroxysmal  Month ago, thinks he had atrial fib Noted irregular rhythm on pulse ox Took metoprolol metoprolol, better after  30 min  Prior episode June 2020 "Had atrial  4 times total "  On last clinic visit, rare atrial fib  HBA1c 5.9 weight up  EKG personally reviewed by myself on todays visit  Does not wear CPAP, could not tolerate it  No regular exercise program  EKG personally reviewed by myself on todays visit Shows NSR rate 51 bpm, no ST or T wave changes  Other past medical history In 2015, was discharged on metoprolol but after reading the side effects, decided not to take the medication.  He feels that his initial episode of atrial fibrillation was secondary to significant gastric discomfort after overeating including eating a bloomin onion. He tends to overeat at the buffet  Echocardiogram 09/06/2013 showed normal ejection fraction, normal right ventricular systolic pressure, moderately dilated left atrium  Stress test dated 09/06/2013 showed no ischemia, normal study, ejection fraction 70%  PMH:   has a past medical history of Hyperlipidemia, Kidney stones, Paroxysmal  A-fib (HCC), and Paroxysmal A-fib (HCC).  PSH:    Past Surgical History:  Procedure Laterality Date  . LITHOTRIPSY     x 8    Current Outpatient Medications  Medication Sig Dispense Refill  . aspirin 81 MG tablet Take 81 mg by mouth daily.    Marland Kitchen b complex vitamins capsule Take 1 capsule by mouth daily.    . metoprolol succinate (TOPROL-XL) 25 MG 24 hr tablet TAKE 1 TABLET BY MOUTH EVERY DAY 90 tablet 0  . metoprolol tartrate (LOPRESSOR) 25 MG tablet Take 1 tablet (25 mg total) by mouth 2 (two) times daily as needed. 60 tablet 3  . Omega-3 Krill Oil 300 MG CAPS Take by mouth.    . ondansetron (ZOFRAN-ODT) 4 MG disintegrating tablet Take by mouth as needed.     Marland Kitchen oxyCODONE (OXY IR/ROXICODONE) 5 MG immediate release tablet     . oxyCODONE-acetaminophen (PERCOCET/ROXICET) 5-325 MG tablet Take by mouth. As needed for kidney stones    . simvastatin (ZOCOR) 40 MG tablet Take 1 tablet (40 mg total) by mouth daily at 6 PM.    . tamsulosin (FLOMAX) 0.4 MG CAPS capsule Take 1 capsule (0.4 mg total) by mouth daily. (Patient taking differently: Take 0.4 mg by mouth daily as needed. ) 90 capsule 3   Current Facility-Administered Medications  Medication Dose Route Frequency Provider Last Rate Last Admin  . betamethasone acetate-betamethasone sodium phosphate (CELESTONE) injection 3 mg  3 mg Intramuscular Once Gala Lewandowsky M, DPM      . betamethasone acetate-betamethasone sodium phosphate (CELESTONE) injection 3 mg  3 mg Intramuscular Once Edrick Kins, DPM         Allergies:   Patient has no known allergies.   Social History:  The patient  reports that he has never smoked. He has never used smokeless tobacco. He reports that he does not drink alcohol or use drugs.   Family History:   family history includes Heart attack (age of onset: 47) in his father; Hypertension in his mother.    Review of Systems: Review of Systems  Constitutional: Negative.   HENT: Negative.   Respiratory: Negative.    Cardiovascular: Negative.   Gastrointestinal: Negative.   Musculoskeletal: Negative.   Neurological: Negative.   Psychiatric/Behavioral: Negative.   All other systems reviewed and are negative.   PHYSICAL EXAM: VS:  BP 110/70 (BP Location: Left Arm, Patient Position: Sitting, Cuff Size: Normal)   Pulse (!) 51   Ht 6' (1.829 m)   Wt 242 lb 8 oz (110 kg)   SpO2 97%   BMI 32.89 kg/m  , BMI Body mass index is 32.89 kg/m.  Constitutional:  oriented to person, place, and time. No distress.  HENT:  Head: Grossly normal Eyes:  no discharge. No scleral icterus.  Neck: No JVD, no carotid bruits  Cardiovascular: Regular rate and rhythm, no murmurs appreciated Pulmonary/Chest: Clear to auscultation bilaterally, no wheezes or rails Abdominal: Soft.  no distension.  no tenderness.  Musculoskeletal: Normal range of motion Neurological:  normal muscle tone. Coordination normal. No atrophy Skin: Skin warm and dry Psychiatric: normal affect, pleasant   Recent Labs: No results found for requested labs within last 8760 hours.    Lipid Panel Lab Results  Component Value Date   CHOL 175 09/06/2013   HDL 29 (L) 09/06/2013   LDLCALC 117 (H) 09/06/2013   TRIG 145 09/06/2013      Wt Readings from Last 3 Encounters:  05/21/19 242 lb 8 oz (110 kg)  11/05/18 239 lb (108.4 kg)  06/06/18 241 lb (109.3 kg)     ASSESSMENT AND PLAN:  Atrial fibrillation, unspecified type (HCC) -  Continue metoprolol succinate 25 mg daily Rare epsiodes, CHADS VASC 0 He will continue to take metoprolol tartrate for breakthrough arrhythmia No anticoagulation at this time, risk and benefit discussed with him  Hyperlipidemia We have encouraged continued exercise, careful diet management in an effort to lose weight.  Obstructive sleep apnea Unable to tolerate CPAP Possible trigger for atrial fib   Total encounter time more than 25 minutes  Greater than 50% was spent in counseling and coordination of care  with the patient  Disposition:   F/U  12 months   Orders Placed This Encounter  Procedures  . EKG 12-Lead     Signed, Esmond Plants, M.D., Ph.D. 05/21/2019  [place], [place]

## 2019-05-21 ENCOUNTER — Encounter: Payer: Self-pay | Admitting: Cardiovascular Disease

## 2019-05-21 ENCOUNTER — Ambulatory Visit (INDEPENDENT_AMBULATORY_CARE_PROVIDER_SITE_OTHER): Payer: BLUE CROSS/BLUE SHIELD | Admitting: Cardiovascular Disease

## 2019-05-21 ENCOUNTER — Other Ambulatory Visit: Payer: Self-pay

## 2019-05-21 ENCOUNTER — Telehealth: Payer: Self-pay | Admitting: Cardiovascular Disease

## 2019-05-21 VITALS — BP 110/70 | HR 51 | Ht 72.0 in | Wt 242.5 lb

## 2019-05-21 DIAGNOSIS — I480 Paroxysmal atrial fibrillation: Secondary | ICD-10-CM | POA: Diagnosis not present

## 2019-05-21 DIAGNOSIS — R079 Chest pain, unspecified: Secondary | ICD-10-CM

## 2019-05-21 DIAGNOSIS — E782 Mixed hyperlipidemia: Secondary | ICD-10-CM

## 2019-05-21 DIAGNOSIS — G4733 Obstructive sleep apnea (adult) (pediatric): Secondary | ICD-10-CM

## 2019-05-21 MED ORDER — METOPROLOL TARTRATE 25 MG PO TABS: 25.0000 mg | ORAL_TABLET | Freq: Two times a day (BID) | ORAL | 3 refills | Status: DC | PRN

## 2019-05-21 NOTE — Telephone Encounter (Signed)
Encounter entered in error.

## 2019-05-21 NOTE — Patient Instructions (Signed)

## 2019-08-14 ENCOUNTER — Other Ambulatory Visit: Payer: Self-pay | Admitting: Cardiovascular Disease

## 2019-10-03 ENCOUNTER — Other Ambulatory Visit: Payer: Self-pay

## 2019-10-03 ENCOUNTER — Telehealth: Payer: Self-pay | Admitting: Cardiovascular Disease

## 2019-10-03 ENCOUNTER — Emergency Department: Payer: BLUE CROSS/BLUE SHIELD

## 2019-10-03 ENCOUNTER — Emergency Department
Admission: EM | Admit: 2019-10-03 | Discharge: 2019-10-03 | Disposition: A | Discharge status: Home / Self Care | Payer: BLUE CROSS/BLUE SHIELD | Attending: Student | Admitting: Student

## 2019-10-03 ENCOUNTER — Encounter: Payer: Self-pay | Admitting: Emergency Medicine

## 2019-10-03 DIAGNOSIS — Z7982 Long term (current) use of aspirin: Secondary | ICD-10-CM | POA: Insufficient documentation

## 2019-10-03 DIAGNOSIS — I480 Paroxysmal atrial fibrillation: Secondary | ICD-10-CM | POA: Diagnosis not present

## 2019-10-03 DIAGNOSIS — R0789 Other chest pain: Secondary | ICD-10-CM | POA: Insufficient documentation

## 2019-10-03 DIAGNOSIS — R002 Palpitations: Secondary | ICD-10-CM | POA: Diagnosis present

## 2019-10-03 DIAGNOSIS — Z79899 Other long term (current) drug therapy: Secondary | ICD-10-CM | POA: Diagnosis not present

## 2019-10-03 DIAGNOSIS — Z87442 Personal history of urinary calculi: Secondary | ICD-10-CM | POA: Insufficient documentation

## 2019-10-03 LAB — MAGNESIUM: Magnesium: 2.1 mg/dL (ref 1.7–2.4)

## 2019-10-03 LAB — BASIC METABOLIC PANEL
Anion gap: 8 (ref 5–15)
BUN: 18 mg/dL (ref 8–23)
CO2: 25 mmol/L (ref 22–32)
Calcium: 8.9 mg/dL (ref 8.9–10.3)
Chloride: 107 mmol/L (ref 98–111)
Creatinine, Ser: 0.89 mg/dL (ref 0.61–1.24)
GFR calc Af Amer: >60 mL/min (ref >60)
GFR calc non Af Amer: >60 mL/min (ref >60)
Glucose, Bld: 188 mg/dL — ABNORMAL HIGH (ref 70–99)
Potassium: 4.2 mmol/L (ref 3.5–5.1)
Sodium: 140 mmol/L (ref 135–145)

## 2019-10-03 LAB — CBC
HCT: 46.2 % (ref 39.0–52.0)
Hemoglobin: 16.1 g/dL (ref 13.0–17.0)
MCH: 31.9 pg (ref 26.0–34.0)
MCHC: 34.8 g/dL (ref 30.0–36.0)
MCV: 91.5 fL (ref 80.0–100.0)
Platelets: 210 10*3/uL (ref 150–400)
RBC: 5.05 MIL/uL (ref 4.22–5.81)
RDW: 12.3 % (ref 11.5–15.5)
WBC: 7 10*3/uL (ref 4.0–10.5)
nRBC: 0 % (ref 0.0–0.2)

## 2019-10-03 LAB — TROPONIN I (HIGH SENSITIVITY)
Troponin I (High Sensitivity): 5 ng/L (ref <18)
Troponin I (High Sensitivity): 4 ng/L (ref <18)

## 2019-10-03 MED ORDER — SODIUM CHLORIDE 0.9% FLUSH: 3.0000 mL | Freq: Once | INTRAVENOUS | Status: DC

## 2019-10-03 NOTE — Telephone Encounter (Signed)
Patient taken to ED.

## 2019-10-03 NOTE — Telephone Encounter (Signed)
Patient c/o Palpitations:  High priority if patient c/o lightheadedness, shortness of breath, or chest pain  1) How long have you had palpitations/irregular HR/ Afib? Are you having the symptoms now? A fib; since 4 am this morning  2) Are you currently experiencing lightheadedness, SOB or CP? SOB, Heart is beating fast, arm hurting  3) Do you have a history of afib (atrial fibrillation) or irregular heart rhythm? yes  4) Have you checked your BP or HR? (document readings if available): no  5) Are you experiencing any other symptoms? Heart racing, arm pain   Patient is in office presenting SOB  Patient advised to go ED and told Mariah Milling is in the hospital.   Wheeled to ED by clinical staff

## 2019-10-03 NOTE — Discharge Instructions (Addendum)
Thank you for letting us take care of you in the emergency department today.   Please continue to take any regular, prescribed medications.   Please follow up with: [name] - call to make an appointment   Please return to the ER for any new or worsening symptoms.

## 2019-10-03 NOTE — ED Provider Notes (Signed)
[facility] Emergency Department Provider Note  ____________________________________________   First MD Initiated Contact with Patient 10/03/19 1049     (approximate)  I have reviewed the triage vital signs and the nursing notes.  History  Chief Complaint Chest Pain    HPI Marcus Bryant is a 64 y.o. male with a history of nephrolithiasis, paroxysmal atrial fibrillation (follows with [name]) who presents to the emergency department for likely paroxysmal atrial fibrillation.  Patient states he woke up this morning and felt like his heart was racing, experience palpitations.  Normally he takes PRN metoprolol when this happens, but instead decided to go back to sleep.  He woke up at 8 AM, feeling okay, started driving to a friend's house to help work on a car, and in route to his friend's he again began experiencing some palpitations and mild chest discomfort.  This was associated with a mild tingling sensation in his left arm.  This episode lasted approximately 10 minutes before resolving.  He feels improved since waiting in the waiting room.  On arrival to his ER room he is noted to be back in sinus rhythm.  Denies any acute complaints at this time, no pain.  Reports compliance with all of his medications.  No new over-the-counter medications or supplements.  No alcohol use.   Past Medical Hx Past Medical History:  Diagnosis Date  . Hyperlipidemia   . Kidney stones   . Paroxysmal A-fib (HCC)   . Paroxysmal A-fib [facility])     Problem List Patient Active Problem List   Diagnosis Date Noted  . Hydronephrosis with urinary obstruction due to ureteral calculus 04/25/2016  . Benign localized hyperplasia of prostate with urinary obstruction 04/30/2014  . A-fib (HCC) 09/26/2013  . Obstructive sleep apnea 09/26/2013  . Obesity 09/26/2013  . Hyperlipidemia 09/26/2013  . Chest pain 09/26/2013  . Gross hematuria 02/21/2013  . Nonspecific finding on examination  of urine 04/10/2012  . Calculus of kidney 01/24/2012  . Disorder of calcium metabolism 01/24/2012  . 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 Male hypogonadism 01/24/2012  . Renal colic 01/24/2012  . Ureterolithiasis 01/24/2012    Past Surgical Hx Past Surgical History:  Procedure Laterality Date  . LITHOTRIPSY     x 8    Medications Prior to Admission medications   Medication Sig Start Date End Date Taking? Authorizing Provider  aspirin 81 MG tablet Take 81 mg by mouth daily.    [provider]  b complex vitamins capsule Take 1 capsule by mouth daily.    [provider]  metoprolol succinate (TOPROL-XL) 25 MG 24 hr tablet TAKE 1 TABLET BY MOUTH EVERY DAY 08/14/19   Antonieta Iba, MD  metoprolol tartrate (LOPRESSOR) 25 MG tablet Take 1 tablet (25 mg total) by mouth 2 (two) times daily as needed. 05/21/19   Antonieta Iba, MD  Omega-3 Krill Oil 300 MG CAPS Take by mouth.    [provider]  ondansetron (ZOFRAN-ODT) 4 MG disintegrating tablet Take by mouth as needed.  01/05/15   [provider]  oxyCODONE (OXY IR/ROXICODONE) 5 MG immediate release tablet  05/30/18   [provider]  oxyCODONE-acetaminophen (PERCOCET/ROXICET) 5-325 MG tablet Take by mouth. As needed for kidney stones 04/05/16   [provider]  simvastatin (ZOCOR) 40 MG tablet Take 1 tablet (40 mg total) by mouth daily at 6 PM. 03/19/18   Gollan, Tollie Pizza, MD  tamsulosin (FLOMAX) 0.4 MG CAPS capsule  Take 1 capsule (0.4 mg total) by mouth daily. Patient taking differently: Take 0.4 mg by mouth daily as needed.  09/18/18   Hollice Espy, MD    Allergies Patient has no known allergies.  Family Hx Family History  Problem Relation Age of Onset  . Hypertension Mother   . Heart attack Father 61    Social Hx Social History   Tobacco Use  . Smoking status: Never Smoker  . Smokeless tobacco: Never Used  Substance Use Topics  . Alcohol use:  No  . Drug use: No     Review of Systems  Constitutional: Negative for fever. Negative for chills. Eyes: Negative for visual changes. ENT: Negative for sore throat. Cardiovascular: + palpitations Respiratory: Negative for shortness of breath. Gastrointestinal: Negative for nausea. Negative for vomiting.  Genitourinary: Negative for dysuria. Musculoskeletal: Negative for leg swelling. Skin: Negative for rash. Neurological: Negative for headaches.   Physical Exam  Vital Signs: ED Triage Vitals  Enc Vitals Group     BP 10/03/19 0928 118/64     Pulse Rate 10/03/19 0928 79     Resp 10/03/19 0928 16     Temp 10/03/19 0932 97.8 F (36.6 C)     Temp src --      SpO2 10/03/19 0928 99 %     Weight 10/03/19 0928 240 lb (108.9 kg)     Height 10/03/19 0928 6' (1.829 m)     Head Circumference --      Peak Flow --      Pain Score 10/03/19 0928 0     Pain Loc --      Pain Edu? --      Excl. in [place]? --     Constitutional: Alert and oriented. Well appearing. NAD.  Head: Normocephalic. Atraumatic. Eyes: Conjunctivae clear. Sclera anicteric. Pupils equal and symmetric. Nose: No masses or lesions. No congestion or rhinorrhea. Mouth/Throat: Wearing mask.  Neck: No stridor. Trachea midline.  Cardiovascular: Normal rate, regular rhythm. Extremities well perfused. Respiratory: Normal respiratory effort.  Lungs CTAB. Gastrointestinal: Soft. Non-distended. Non-tender.  Genitourinary: Deferred. Musculoskeletal: No lower extremity edema. No deformities. Neurologic:  Normal speech and language. No gross focal or lateralizing neurologic deficits are appreciated.  Skin: Skin is warm, dry and intact. No rash noted. Psychiatric: Mood and affect are appropriate for situation.  EKG  Personally reviewed and interpreted by myself.   Date: 10/03/19 Time: 0918 Rate: 87 Rhythm: atrial fibrillation Axis: normal Intervals: WNL Question borderline ST changes (but not STEMI criteria) in I AF,  rate controlled  Repeat at 0923 Rate: 99 Rhythm: AF Axis: normal Intervals: WNL AF, rate controlled  Repeat at 10:59 demonstrates return to NSR Rate: 60 Rhythm: Sinus Axis: Normal Intervals: Within normal limits NSR No acute ischemic changes No acute arrhythmias  Radiology  Personally reviewed available imaging myself.   CXR - IMPRESSION:  Chronic bronchitic changes without acute infiltrate.    Procedures  Procedure(s) performed (including critical care):  .1-3 Lead EKG Interpretation Performed by: Lilia Pro., MD Authorized by: Lilia Pro., MD     ECG rate assessment: normal     Rhythm: sinus rhythm   Comments:     Indication: palpitations, AF on initial twelve-lead EKG Impression: Return to NSR     Initial Impression / Assessment and Plan / MDM / ED Course  64 y.o. male who presents to the ED for an episode of palpitations, chest discomfort, consistent with episode of paroxysmal atrial fibrillation, now with return to sinus  rhythm  Ddx: paroxysmal AF, ACS, electrolyte abnormality, other arrhythmia  Will plan for labs, imaging, cardiac monitoring.  Initial EKG on arrival demonstrates AF, rate controlled.  Repeat done at 10:59 AM demonstrates return to NSR, as does his cardiac monitoring.    Labs obtained without actionable derangements.  No anemia.  Electrolytes unremarkable aside from mild hyperglycemia without gap.  Initial troponin negative.  CXR negative.  Discussed need for repeat troponin, he is agreeable with this lab draw, however he must leave prior to the results as he needs to make it to the veterinarian in time to pick up his pet.  He states he does not have anyone else to help with this.  He understands if this repeat troponin is elevated, the recommendation would be for admission for further cardiac evaluation.  He voices understanding of this but still needs to leave.  He agrees to return back to the emergency department for further  evaluation if his troponin results elevated after his discharge.  We will call him if this is the case.  As such, will proceed with discharge and will follow his delta troponin on chart review.  Discussed case with [name], no acute changes to his medications at this time, will follow up closely in the clinic. Patient aware, advised to call the clinic to schedule an outpatient follow-up appointment. Given return precautions.   12:02 PM Patient's delta troponin has resulted, is negative.   _______________________________   As part of my medical decision making I have reviewed available labs, radiology tests, reviewed old records/performed chart review.    Final Clinical Impression(s) / ED Diagnosis  Paroxysmal atrial fibrillation    Note:  This document was prepared using Dragon voice recognition software and may include unintentional dictation errors.   Miguel Aschoff., MD 10/03/19 [phone]

## 2019-10-03 NOTE — ED Notes (Signed)
Repeat EKG per verbal Monks.

## 2019-10-03 NOTE — ED Triage Notes (Signed)
Woke with with chest pain left 4am.  Also had some heart racing.  Has history of a fib he had a few times and is on metoprolol.  Went to work this am and he went to [facility] this [facility].  They sent him here.

## 2019-10-03 NOTE — ED Notes (Signed)
MD Monks at bedside

## 2019-10-03 NOTE — ED Notes (Signed)
Pt verbalized understanding of discharge instructions. NAD at this time. 

## 2019-10-05 NOTE — Progress Notes (Signed)
Cardiology Office Note  Date:  10/06/2019   ID:  Marcus Bryant, Marcus Bryant 1955/11/11, MRN [id number]  PCP:  Marina Goodell, MD   Chief Complaint  Patient presents with   Follow-up    was at [facility] ER; A-Fib & kidney stones. Meds reviewed by the pt. verbally. "doing well."     HPI:  Marcus Bryant is a pleasant 64 year old gentleman with history of  snoring, obstructive sleep apnea, positive sleep study per the patient and 2006  does not wear CPAP,  kidney stones, frequent hyperlipidemia,  abnormal stress test in 2008 who presented to the hospital 09/05/2013 with chest pain, atrial fibrillation. converted to normal sinus rhythm en route,  stress test showed no ischemia, CHADS VASC 0  echocardiogram showed normal ejection fraction with dilated left atrium He presents today for follow-up of his atrial fibrillation, paroxysmal  Seen in the emergency room October 03, 2019 for atrial fibrillation Converted to normal sinus rhythm on his own Did not take extra metoprolol tartrate that morning Feels atrial fibrillation comes on at 4 AM ER notes reviewed Atrial fib confirmed on EKG at 9 Am, back into NSR at 11 AM At the same time, "peeing blood", kidney stones Had nausea the night before, was trying to make himself throw up ("to feel better")  Has a hernia that hurts, maybe worse recently from straining Difficulty getting up onto the exam table today, makes the hernia pain worse  Has been trying to walk more for weight loss  Continued sleep d/o Previously declined CPAP Again discussed relation with sleep disorder and A. fib Previously reported he was unable to tolerate CPAP  He reported having a handful of atrial fibrillation episodes in 2020, 1 every 2 to 3 months  No regular exercise program  EKG personally reviewed by myself on todays visit Normal sinus rhythm rate 58 bpm with no significant ST or T wave changes  No recent echocardiogram available Prior echocardiogram  2015  Other past medical history In 2015, was discharged on metoprolol but after reading the side effects, decided not to take the medication.  He feels that his initial episode of atrial fibrillation was secondary to significant gastric discomfort after overeating including eating a bloomin onion. He tends to overeat at the buffet  Echocardiogram 09/06/2013 showed normal ejection fraction, normal right ventricular systolic pressure, moderately dilated left atrium  Stress test dated 09/06/2013 showed no ischemia, normal study, ejection fraction 70%  PMH:   has a past medical history of Hyperlipidemia, Kidney stones, Paroxysmal A-fib (HCC), and Paroxysmal A-fib (HCC).  PSH:    Past Surgical History:  Procedure Laterality Date   LITHOTRIPSY     x 8    Current Outpatient Medications  Medication Sig Dispense Refill   aspirin 81 MG tablet Take 81 mg by mouth daily.     b complex vitamins capsule Take 1 capsule by mouth daily.     metoprolol succinate (TOPROL-XL) 25 MG 24 hr tablet TAKE 1 TABLET BY MOUTH EVERY DAY 90 tablet 3   metoprolol tartrate (LOPRESSOR) 25 MG tablet Take 1 tablet (25 mg total) by mouth 2 (two) times daily as needed. 60 tablet 3   Omega-3 Krill Oil 300 MG CAPS Take by mouth.     ondansetron (ZOFRAN-ODT) 4 MG disintegrating tablet Take by mouth as needed.      oxyCODONE (OXY IR/ROXICODONE) 5 MG immediate release tablet      oxyCODONE-acetaminophen (PERCOCET/ROXICET) 5-325 MG tablet Take by mouth. As needed for kidney stones  potassium citrate (UROCIT-K) 10 MEQ (1080 MG) SR tablet Take 10 mEq by mouth daily.      simvastatin (ZOCOR) 40 MG tablet Take 1 tablet (40 mg total) by mouth daily at 6 PM.     tamsulosin (FLOMAX) 0.4 MG CAPS capsule Take 1 capsule (0.4 mg total) by mouth daily. (Patient taking differently: Take 0.4 mg by mouth daily as needed. ) 90 capsule 3   Current Facility-Administered Medications  Medication Dose Route Frequency Provider  Last Rate Last Admin   betamethasone acetate-betamethasone sodium phosphate (CELESTONE) injection 3 mg  3 mg Intramuscular Once Gala Lewandowsky M, DPM       betamethasone acetate-betamethasone sodium phosphate (CELESTONE) injection 3 mg  3 mg Intramuscular Once Felecia Shelling, DPM         Allergies:   Patient has no known allergies.   Social History:  The patient  reports that he has never smoked. He has never used smokeless tobacco. He reports that he does not drink alcohol and does not use drugs.   Family History:   family history includes Heart attack (age of onset: 35) in his father; Hypertension in his mother.    Review of Systems: Review of Systems  Constitutional: Negative.   HENT: Negative.   Respiratory: Negative.   Cardiovascular: Negative.   Gastrointestinal: Negative.   Musculoskeletal: Negative.   Neurological: Negative.   Psychiatric/Behavioral: Negative.   All other systems reviewed and are negative.   PHYSICAL EXAM: VS:  BP 120/70 (BP Location: Left Arm, Patient Position: Sitting, Cuff Size: Normal)    Pulse (!) 59    Ht 6' (1.829 m)    Wt 241 lb 4 oz (109.4 kg)    SpO2 98%    BMI 32.72 kg/m  , BMI Body mass index is 32.72 kg/m.  Constitutional:  oriented to person, place, and time. No distress.  HENT:  Head: Grossly normal Eyes:  no discharge. No scleral icterus.  Neck: No JVD, no carotid bruits  Cardiovascular: Regular rate and rhythm, no murmurs appreciated Pulmonary/Chest: Clear to auscultation bilaterally, no wheezes or rails Abdominal: Soft.  no distension.  no tenderness.  Musculoskeletal: Normal range of motion Neurological:  normal muscle tone. Coordination normal. No atrophy Skin: Skin warm and dry Psychiatric: normal affect, pleasant   Recent Labs: 10/03/2019: BUN 18; Creatinine, Ser 0.89; Hemoglobin 16.1; Magnesium 2.1; Platelets 210; Potassium 4.2; Sodium 140    Lipid Panel Lab Results  Component Value Date   CHOL 175 09/06/2013   HDL 29  (L) 09/06/2013   LDLCALC 117 (H) 09/06/2013   TRIG 145 09/06/2013      Wt Readings from Last 3 Encounters:  10/06/19 241 lb 4 oz (109.4 kg)  10/03/19 240 lb (108.9 kg)  05/21/19 242 lb 8 oz (110 kg)     ASSESSMENT AND PLAN:  Atrial fibrillation, unspecified type (HCC) -  Paroxysmal episodes, recent trip to the emergency room October 03, 2019 Continue metoprolol succinate 25 mg daily Rare epsiodes, CHADS VASC 0 For breakthrough atrial fib, take metoprolol tartarte/ add flecainide 100 mg x 1 Call us if it does not go back to normal sinus rhythm, may need to repeat medication load Depending on his symptoms may not need to go to the emergency room for every episode For increasing frequency may need to start taking flecainide on a regular basis -Again discussed sleep disordered breathing  Hyperlipidemia We have encouraged continued exercise, careful diet management in an effort to lose weight.  Obstructive sleep apnea  Unable to tolerate CPAP Possible trigger for atrial fib Discussed with him again   Total encounter time more than 25 minutes  Greater than 50% was spent in counseling and coordination of care with the patient  Disposition:   F/U  6 months   Orders Placed This Encounter  Procedures   EKG 12-Lead     Signed, Esmond Plants, M.D., Ph.D. 10/06/2019  [place], [place]

## 2019-10-06 ENCOUNTER — Ambulatory Visit (INDEPENDENT_AMBULATORY_CARE_PROVIDER_SITE_OTHER): Payer: Self-pay | Admitting: Cardiovascular Disease

## 2019-10-06 ENCOUNTER — Other Ambulatory Visit: Payer: Self-pay

## 2019-10-06 ENCOUNTER — Encounter: Payer: Self-pay | Admitting: Cardiovascular Disease

## 2019-10-06 VITALS — BP 120/70 | HR 59 | Ht 72.0 in | Wt 241.2 lb

## 2019-10-06 DIAGNOSIS — R079 Chest pain, unspecified: Secondary | ICD-10-CM

## 2019-10-06 DIAGNOSIS — I480 Paroxysmal atrial fibrillation: Secondary | ICD-10-CM

## 2019-10-06 DIAGNOSIS — E782 Mixed hyperlipidemia: Secondary | ICD-10-CM

## 2019-10-06 DIAGNOSIS — G4733 Obstructive sleep apnea (adult) (pediatric): Secondary | ICD-10-CM

## 2019-10-06 DIAGNOSIS — K429 Umbilical hernia without obstruction or gangrene: Secondary | ICD-10-CM

## 2019-10-06 MED ORDER — FLECAINIDE ACETATE 100 MG PO TABS
100.0000 mg | ORAL_TABLET | Freq: Two times a day (BID) | ORAL | 3 refills | Status: DC | PRN
Start: 2019-10-06 — End: 2020-04-06

## 2019-10-06 NOTE — Patient Instructions (Addendum)
Referral to Dr. Lemar Livings ,  [place] [address] (2nd floor) [place] [address] [phone] After 10/23/19 # [phone]  Scheduled tomorrow 10/07/2019 at 3:00 PM   Echo for atrial fibrillation  Medication Instructions:  For atrial fibrillation, take metoprolol  tartrate x1 and flecainide 100 mg pill x 1 Relax and wait to go back into normal rhythm  Follow rhythm with "Alive[facility]"  If you need a refill on your cardiac medications before your next appointment, please call your pharmacy.    Lab work: No new labs needed   If you have labs (blood work) drawn today and your tests are completely normal, you will receive your results only by: Marland Kitchen MyChart Message (if you have MyChart) OR . A paper copy in the mail If you have any lab test that is abnormal or we need to change your treatment, we will call you to review the results.   Testing/Procedures: Your physician has requested that you have an echocardiogram. Echocardiography is a painless test that uses sound waves to create images of your heart. It provides your doctor with information about the size and shape of your heart and how well your heart's chambers and valves are working. This procedure takes approximately one hour. There are no restrictions for this procedure.     Follow-Up: At [facility], you and your health needs are our priority.  As part of our continuing mission to provide you with exceptional heart care, we have created designated Provider Care Teams.  These Care Teams include your primary Cardiologist (physician) and Advanced Practice Providers (APPs -  Physician Assistants and Nurse Practitioners) who all work together to provide you with the care you need, when you need it.  . You will need a follow up appointment in 6 months   . Providers on your designated Care Team:   . Nicolasa Ducking, NP . Eula Listen, PA-C . Marisue Ivan, PA-C  Any Other Special Instructions Will Be Listed  Below (If Applicable).  For educational health videos Log in to : [email or web address] Or : [email or web address], password : [email or web address]

## 2019-10-26 ENCOUNTER — Ambulatory Visit
Admission: EM | Admit: 2019-10-26 | Discharge: 2019-10-26 | Disposition: A | Discharge status: Home / Self Care | Payer: BLUE CROSS/BLUE SHIELD | Attending: Internal Medicine | Admitting: Internal Medicine

## 2019-10-26 ENCOUNTER — Other Ambulatory Visit: Payer: Self-pay

## 2019-10-26 ENCOUNTER — Encounter: Payer: Self-pay | Admitting: Emergency Medicine

## 2019-10-26 DIAGNOSIS — L03116 Cellulitis of left lower limb: Secondary | ICD-10-CM | POA: Diagnosis not present

## 2019-10-26 MED ORDER — DOXYCYCLINE HYCLATE 100 MG PO CAPS
100.0000 mg | ORAL_CAPSULE | Freq: Two times a day (BID) | ORAL | 0 refills | Status: DC
Start: 2019-10-26 — End: 2020-04-06

## 2019-10-26 MED ORDER — CEFTRIAXONE SODIUM 1 G IJ SOLR
1.0000 g | Freq: Once | INTRAMUSCULAR | Status: AC
  Administered 2019-10-26: 1 g via INTRAMUSCULAR

## 2019-10-26 NOTE — Discharge Instructions (Addendum)
Go to the ER if the redness advances up your leg in a streak fashion, you will need IV antibiotics.

## 2019-10-26 NOTE — ED Triage Notes (Signed)
Patient c/o possible insect bite that started on Wed.  Patient reports swelling, redness and tenderness at this site.

## 2019-10-26 NOTE — ED Provider Notes (Signed)
[facility]    CSN: [id number] Arrival date & time: 10/26/19  1050      History   Chief Complaint Chief Complaint  Patient presents with  . Insect Bite  . Cellulitis    HPI Marcus Bryant is a 64 y.o. male. who presents with possibly an insect bite 4 days ago. He did not feel a sting. He was working in the yard and when he went into his house felt a little itching and saw had a red bump and a black dot in the middle. Has been applying neosporin and peroxide and the redness is getting worse. The redness is getting larger faster since yesterday. Denies past hx of MRSA or staph infection.     Past Medical History:  Diagnosis Date  . Hyperlipidemia   . Kidney stones   . Paroxysmal A-fib (HCC)   . Paroxysmal A-fib [facility])     Patient Active Problem List   Diagnosis Date Noted  . Hydronephrosis with urinary obstruction due to ureteral calculus 04/25/2016  . Benign localized hyperplasia of prostate with urinary obstruction 04/30/2014  . A-fib (HCC) 09/26/2013  . Obstructive sleep apnea 09/26/2013  . Obesity 09/26/2013  . Hyperlipidemia 09/26/2013  . Chest pain 09/26/2013  . Gross hematuria 02/21/2013  . Nonspecific finding on examination of urine 04/10/2012  . Calculus of kidney 01/24/2012  . Disorder of calcium metabolism 01/24/2012  . 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 Male hypogonadism 01/24/2012  . Renal colic 01/24/2012  . Ureterolithiasis 01/24/2012    Past Surgical History:  Procedure Laterality Date  . LITHOTRIPSY     x 8       Home Medications    Prior to Admission medications   Medication Sig Start Date End Date Taking? Authorizing Provider  aspirin 81 MG tablet Take 81 mg by mouth daily.   Yes [provider]  b complex vitamins capsule Take 1 capsule by mouth daily.   Yes [provider]  flecainide (TAMBOCOR) 100 MG tablet Take 1 tablet (100 mg total) by mouth 2 (two) times daily as needed (As  needed for atrial fibrillation.). 10/06/19  Yes Gollan, Tollie Pizza, MD  metoprolol succinate (TOPROL-XL) 25 MG 24 hr tablet TAKE 1 TABLET BY MOUTH EVERY DAY 08/14/19  Yes Gollan, Tollie Pizza, MD  metoprolol tartrate (LOPRESSOR) 25 MG tablet Take 1 tablet (25 mg total) by mouth 2 (two) times daily as needed. 05/21/19  Yes Gollan, Tollie Pizza, MD  Omega-3 Krill Oil 300 MG CAPS Take by mouth.   Yes [provider]  potassium citrate (UROCIT-K) 10 MEQ (1080 MG) SR tablet Take 10 mEq by mouth daily.  07/15/19 07/09/20 Yes [provider]  simvastatin (ZOCOR) 40 MG tablet Take 1 tablet (40 mg total) by mouth daily at 6 PM. 03/19/18  Yes Gollan, Tollie Pizza, MD  tamsulosin (FLOMAX) 0.4 MG CAPS capsule Take 1 capsule (0.4 mg total) by mouth daily. Patient taking differently: Take 0.4 mg by mouth daily as needed.  09/18/18  Yes Vanna Scotland, MD  doxycycline (VIBRAMYCIN) 100 MG capsule Take 1 capsule (100 mg total) by mouth 2 (two) times daily. 10/26/19   Rodriguez-Southworth, Nettie Elm, PA-C  ondansetron (ZOFRAN-ODT) 4 MG disintegrating tablet Take by mouth as needed.  01/05/15   [provider]  oxyCODONE (OXY IR/ROXICODONE) 5 MG immediate release tablet  05/30/18   [provider]  oxyCODONE-acetaminophen (PERCOCET/ROXICET) 5-325 MG tablet Take by mouth. As needed for kidney stones 04/05/16  [provider]    Family History Family History  Problem Relation Age of Onset  . Hypertension Mother   . Heart attack Father 69    Social History Social History   Tobacco Use  . Smoking status: Never Smoker  . Smokeless tobacco: Never Used  Vaping Use  . Vaping Use: Never used  Substance Use Topics  . Alcohol use: No  . Drug use: No     Allergies   Patient has no known allergies.   Review of Systems Review of Systems  Constitutional: Negative for chills, diaphoresis and fever.  Musculoskeletal: Negative for arthralgias, gait problem, joint swelling and myalgias.    Skin: Positive for color change and wound. Negative for rash.  Neurological: Negative for numbness.  Hematological: Negative for adenopathy.     Physical Exam Triage Vital Signs ED Triage Vitals  Enc Vitals Group     BP 10/26/19 1110 120/79     Pulse Rate 10/26/19 1110 (!) 58     Resp 10/26/19 1110 16     Temp 10/26/19 1110 98.3 F (36.8 C)     Temp Source 10/26/19 1110 Oral     SpO2 10/26/19 1110 97 %     Weight 10/26/19 1106 240 lb (108.9 kg)     Height 10/26/19 1106 6' (1.829 m)     Head Circumference --      Peak Flow --      Pain Score 10/26/19 1105 0     Pain Loc --      Pain Edu? --      Excl. in GC? --    No data found.  Updated Vital Signs BP 120/79 (BP Location: Right Arm)   Pulse (!) 58   Temp 98.3 F (36.8 C) (Oral)   Resp 16   Ht 6' (1.829 m)   Wt 240 lb (108.9 kg)   SpO2 97%   BMI 32.55 kg/m   Visual Acuity Right Eye Distance:   Left Eye Distance:   Bilateral Distance:    Right Eye Near:   Left Eye Near:    Bilateral Near:     Physical Exam Vitals and nursing note reviewed.  Constitutional:      General: He is not in acute distress.    Appearance: He is obese.     Comments: He is hard of hearing  HENT:     Head: Normocephalic.     Right Ear: External ear normal.     Left Ear: External ear normal.  Eyes:     General: No scleral icterus.    Conjunctiva/sclera: Conjunctivae normal.  Pulmonary:     Effort: Pulmonary effort is normal.  Musculoskeletal:        General: Normal range of motion.     Cervical back: Neck supple.     Comments: L LOWER LEG- the bite site has a scab, and has erythema surrounding it and goes across 2/3 of the diameter of his lower leg about 5 inches high. This redness os warm, but there are no streaking going up his leg.   Skin:    Capillary Refill: Capillary refill takes less than 2 seconds.  Neurological:     Mental Status: He is alert and oriented to person, place, and time.     Gait: Gait normal.   Psychiatric:        Mood and Affect: Mood normal.        Behavior: Behavior normal.        Thought  Content: Thought content normal.        Judgment: Judgment normal.      UC Treatments / Results  Labs (all labs ordered are listed, but only abnormal results are displayed) Labs Reviewed - No data to display  EKG   Radiology No results found.  Procedures Procedures (including critical care time)  Medications Ordered in UC Medications  cefTRIAXone (ROCEPHIN) injection 1 g (1 g Intramuscular Given 10/26/19 1154)    Initial Impression / Assessment and Plan / UC Course  I have reviewed the triage vital signs and the nursing notes. Has cellulitis of lower leg from insect bite. He was given Rocephin 1 G here and he is to start Doxy as prescribed tomorrow. If he develops a red streaking going up his  Leg, was told to go to ER Final Clinical Impressions(s) / UC Diagnoses   Final diagnoses:  Cellulitis of left lower extremity     Discharge Instructions     Go to the ER if the redness advances up your leg in a streak fashion, you will need IV antibiotics.     ED Prescriptions    Medication Sig Dispense Auth. Provider   doxycycline (VIBRAMYCIN) 100 MG capsule Take 1 capsule (100 mg total) by mouth 2 (two) times daily. 20 capsule Rodriguez-Southworth, Nettie Elm, PA-C     PDMP not reviewed this encounter.   Garey Ham, [place] 10/26/19 1623

## 2019-11-07 ENCOUNTER — Ambulatory Visit (INDEPENDENT_AMBULATORY_CARE_PROVIDER_SITE_OTHER): Payer: BLUE CROSS/BLUE SHIELD

## 2019-11-07 ENCOUNTER — Other Ambulatory Visit: Payer: Self-pay

## 2019-11-07 DIAGNOSIS — I480 Paroxysmal atrial fibrillation: Secondary | ICD-10-CM

## 2019-11-07 LAB — ECHOCARDIOGRAM COMPLETE
AR max vel: 4.24 cm2
AV Area VTI: 5.18 cm2
AV Area mean vel: 4.6 cm2
AV Mean grad: 4 mmHg
AV Peak grad: 8.1 mmHg
Ao pk vel: 1.42 m/s
Area-P 1/2: 4.6 cm2
Calc EF: 63.4 %
S' Lateral: 2.2 cm
Single Plane A2C EF: 58.5 %
Single Plane A4C EF: 64.9 %

## 2020-04-05 NOTE — Progress Notes (Signed)
Cardiology Office Note  Date:  04/06/2020   ID:  Marcus, Armstrong 1955-08-14, MRN [id number]  PCP:  Marina Goodell, MD   Chief Complaint  Patient presents with  . Follow-up    6 Months follow up and medications verbally reviewed with patient.     HPI:  Marcus Bryant is a pleasant 64 year old gentleman with history of  snoring, obstructive sleep apnea, positive sleep study per the patient and 2006  does not wear CPAP, sleeps in his stomach, mask would lose suction kidney stones, frequent hyperlipidemia,  abnormal stress test in 2008 who presented to the hospital 09/05/2013 with chest pain, atrial fibrillation. converted to normal sinus rhythm en route,  stress test showed no ischemia, CHADS VASC 0 emergency room October 03, 2019 for atrial fibrillation  echocardiogram showed normal ejection fraction with dilated left atrium He presents today for follow-up of his atrial fibrillation, paroxysmal  Problem with hernia, Has seen Dr. Doristine Counter from general surgery Waiting to have it fixed in March, medicare kicks in Having lots of pain, does heavy lifting, or if he rubs it  No recent atrial fibrillation spells Has not been taking metoprolol  Atrial fib always happens 4 AM, does not wear his CPAP Details as above  Total chol 177 LDL 118  Walking on the track Weight high, 240 to 255  No regular exercise program  EKG personally reviewed by myself on todays visit NSR rate 59 bpm no ST or T wave changes  Other past medical hx emergency room October 03, 2019 for atrial fibrillation Converted to normal sinus rhythm on his own At the same time, "peeing blood", kidney stones Had nausea the night before, was trying to make himself throw up  Prior echocardiogram 2015  In 2015, was discharged on metoprolol but after reading the side effects, decided not to take the medication.  He feels that his initial episode of atrial fibrillation was secondary to significant gastric  discomfort after overeating including eating a bloomin onion. He tends to overeat at the buffet  Echocardiogram 09/06/2013 showed normal ejection fraction, normal right ventricular systolic pressure, moderately dilated left atrium  Stress test dated 09/06/2013 showed no ischemia, normal study, ejection fraction 70%  PMH:   has a past medical history of Hyperlipidemia, Kidney stones, Paroxysmal A-fib (HCC), and Paroxysmal A-fib (HCC).  PSH:    Past Surgical History:  Procedure Laterality Date  . LITHOTRIPSY     x 8    Current Outpatient Medications  Medication Sig Dispense Refill  . aspirin 81 MG tablet Take 81 mg by mouth daily.    Marland Kitchen b complex vitamins capsule Take 1 capsule by mouth daily.    . flecainide (TAMBOCOR) 100 MG tablet Take 1 tablet (100 mg total) by mouth 2 (two) times daily as needed (As needed for atrial fibrillation.). 180 tablet 3  . metoprolol succinate (TOPROL-XL) 25 MG 24 hr tablet TAKE 1 TABLET BY MOUTH EVERY DAY 90 tablet 3  . metoprolol tartrate (LOPRESSOR) 25 MG tablet Take 1 tablet (25 mg total) by mouth 2 (two) times daily as needed. 60 tablet 3  . Omega-3 Krill Oil 300 MG CAPS Take by mouth.    . ondansetron (ZOFRAN-ODT) 4 MG disintegrating tablet Take by mouth as needed.     . potassium citrate (UROCIT-K) 10 MEQ (1080 MG) SR tablet Take 10 mEq by mouth daily.     . simvastatin (ZOCOR) 40 MG tablet Take 1 tablet (40 mg total) by mouth daily at 6  PM.    . tamsulosin (FLOMAX) 0.4 MG CAPS capsule Take 1 capsule (0.4 mg total) by mouth daily. (Patient taking differently: Take 0.4 mg by mouth daily as needed.) 90 capsule 3   Current Facility-Administered Medications  Medication Dose Route Frequency Provider Last Rate Last Admin  . betamethasone acetate-betamethasone sodium phosphate (CELESTONE) injection 3 mg  3 mg Intramuscular Once Gala Lewandowsky M, DPM      . betamethasone acetate-betamethasone sodium phosphate (CELESTONE) injection 3 mg  3 mg Intramuscular  Once Felecia Shelling, DPM        Allergies:   Patient has no known allergies.   Social History:  The patient  reports that he has never smoked. He has never used smokeless tobacco. He reports that he does not drink alcohol and does not use drugs.   Family History:   family history includes Heart attack (age of onset: 40) in his father; Hypertension in his mother.    Review of Systems: Review of Systems  Constitutional: Negative.   HENT: Negative.   Respiratory: Negative.   Cardiovascular: Negative.   Gastrointestinal: Negative.        Umbilical hernia pain  Musculoskeletal: Negative.   Neurological: Negative.   Psychiatric/Behavioral: Negative.   All other systems reviewed and are negative.   PHYSICAL EXAM: VS:  BP 140/70 (BP Location: Left Arm, Patient Position: Sitting, Cuff Size: Normal)   Pulse (!) 59   Ht 6' (1.829 m)   Wt 255 lb (115.7 kg)   SpO2 96%   BMI 34.58 kg/m  , BMI Body mass index is 34.58 kg/m.  Constitutional:  oriented to person, place, and time. No distress.  HENT:  Head: Grossly normal Eyes:  no discharge. No scleral icterus.  Neck: No JVD, no carotid bruits  Cardiovascular: Regular rate and rhythm, no murmurs appreciated Pulmonary/Chest: Clear to auscultation bilaterally, no wheezes or rails Abdominal: Soft.  no distension.  no tenderness.  Musculoskeletal: Normal range of motion Neurological:  normal muscle tone. Coordination normal. No atrophy Skin: Skin warm and dry Psychiatric: normal affect, pleasant  Recent Labs: 10/03/2019: BUN 18; Creatinine, Ser 0.89; Hemoglobin 16.1; Magnesium 2.1; Platelets 210; Potassium 4.2; Sodium 140    Lipid Panel Lab Results  Component Value Date   CHOL 175 09/06/2013   HDL 29 (L) 09/06/2013   LDLCALC 117 (H) 09/06/2013   TRIG 145 09/06/2013    Wt Readings from Last 3 Encounters:  04/06/20 255 lb (115.7 kg)  10/26/19 240 lb (108.9 kg)  10/06/19 241 lb 4 oz (109.4 kg)     ASSESSMENT AND  PLAN:  Atrial fibrillation, unspecified type (HCC) -  Paroxysmal episodes,  emergency room October 03, 2019 Continue metoprolol succinate 25 mg daily Rare epsiodes, CHADS VASC 0 Suggested he take metoprolol tartarte/ add flecainide 100 mg x 1 Kardiomobile to monitor rhythm  Hyperlipidemia We have encouraged continued exercise, careful diet management in an effort to lose weight.  Obstructive sleep apnea Unable to tolerate CPAP, sleeps in stomach Possible trigger for atrial fib Discussed with him again, we will not order another sleep study   Total encounter time more than 25 minutes  Greater than 50% was spent in counseling and coordination of care with the patient   Orders Placed This Encounter  Procedures  . EKG 12-Lead    Signed, Dossie Arbour, M.D., Ph.D. 04/06/2020  [facility] [place], [place] [phone]

## 2020-04-06 ENCOUNTER — Ambulatory Visit (INDEPENDENT_AMBULATORY_CARE_PROVIDER_SITE_OTHER): Payer: BLUE CROSS/BLUE SHIELD | Admitting: Cardiovascular Disease

## 2020-04-06 ENCOUNTER — Encounter: Payer: Self-pay | Admitting: Cardiovascular Disease

## 2020-04-06 ENCOUNTER — Other Ambulatory Visit: Payer: Self-pay

## 2020-04-06 VITALS — BP 140/70 | HR 59 | Ht 72.0 in | Wt 255.0 lb

## 2020-04-06 DIAGNOSIS — R079 Chest pain, unspecified: Secondary | ICD-10-CM

## 2020-04-06 DIAGNOSIS — K429 Umbilical hernia without obstruction or gangrene: Secondary | ICD-10-CM

## 2020-04-06 DIAGNOSIS — G4733 Obstructive sleep apnea (adult) (pediatric): Secondary | ICD-10-CM

## 2020-04-06 DIAGNOSIS — E782 Mixed hyperlipidemia: Secondary | ICD-10-CM | POA: Diagnosis not present

## 2020-04-06 DIAGNOSIS — I480 Paroxysmal atrial fibrillation: Secondary | ICD-10-CM

## 2020-04-06 MED ORDER — FLECAINIDE ACETATE 100 MG PO TABS: 100.0000 mg | ORAL_TABLET | Freq: Two times a day (BID) | ORAL | 1 refills | Status: DC | PRN

## 2020-04-06 NOTE — Patient Instructions (Addendum)
Considering buying K[employer]ia mobile Search for "smartphone EKG"   Medication Instructions:  Take flecainide 100 mg with metoprolol for atrial fibrillation epsiodes As needed  If you need a refill on your cardiac medications before your next appointment, please call your pharmacy.    Lab work: No new labs needed   If you have labs (blood work) drawn today and your tests are completely normal, you will receive your results only by:  MyChart Message (if you have MyChart) OR  A paper copy in the mail If you have any lab test that is abnormal or we need to change your treatment, we will call you to review the results.   Testing/Procedures: No new testing needed   Follow-Up: At [facility], you and your health needs are our priority.  As part of our continuing mission to provide you with exceptional heart care, we have created designated Provider Care Teams.  These Care Teams include your primary Cardiologist (physician) and Advanced Practice Providers (APPs -  Physician Assistants and Nurse Practitioners) who all work together to provide you with the care you need, when you need it.   You will need a follow up appointment in 12 months   Providers on your designated Care Team:    Nicolasa Ducking, NP  Eula Listen, PA-C  Marisue Ivan, PA-C  Any Other Special Instructions Will Be Listed Below (If Applicable).  COVID-19 Vaccine Information can be found at: PodExchange.[email or web address] For questions related to vaccine distribution or appointments, please email [email or web address] or call [phone].

## 2020-04-24 DIAGNOSIS — U071 COVID-19: Secondary | ICD-10-CM

## 2020-04-24 HISTORY — DX: COVID-19: U07.1

## 2020-05-03 IMAGING — CR DG ABDOMEN 1V
1 series · 2 of 2 positions shown · non-contrast
Comparison: 02/18/2013 and prior radiographs

CLINICAL DATA: Nephrolithiasis.

EXAM:
ABDOMEN - 1 VIEW

[Series 1: dg abd 1 view · 0.14mm/px · 2 of 2 slices shown]
[im 1/2]
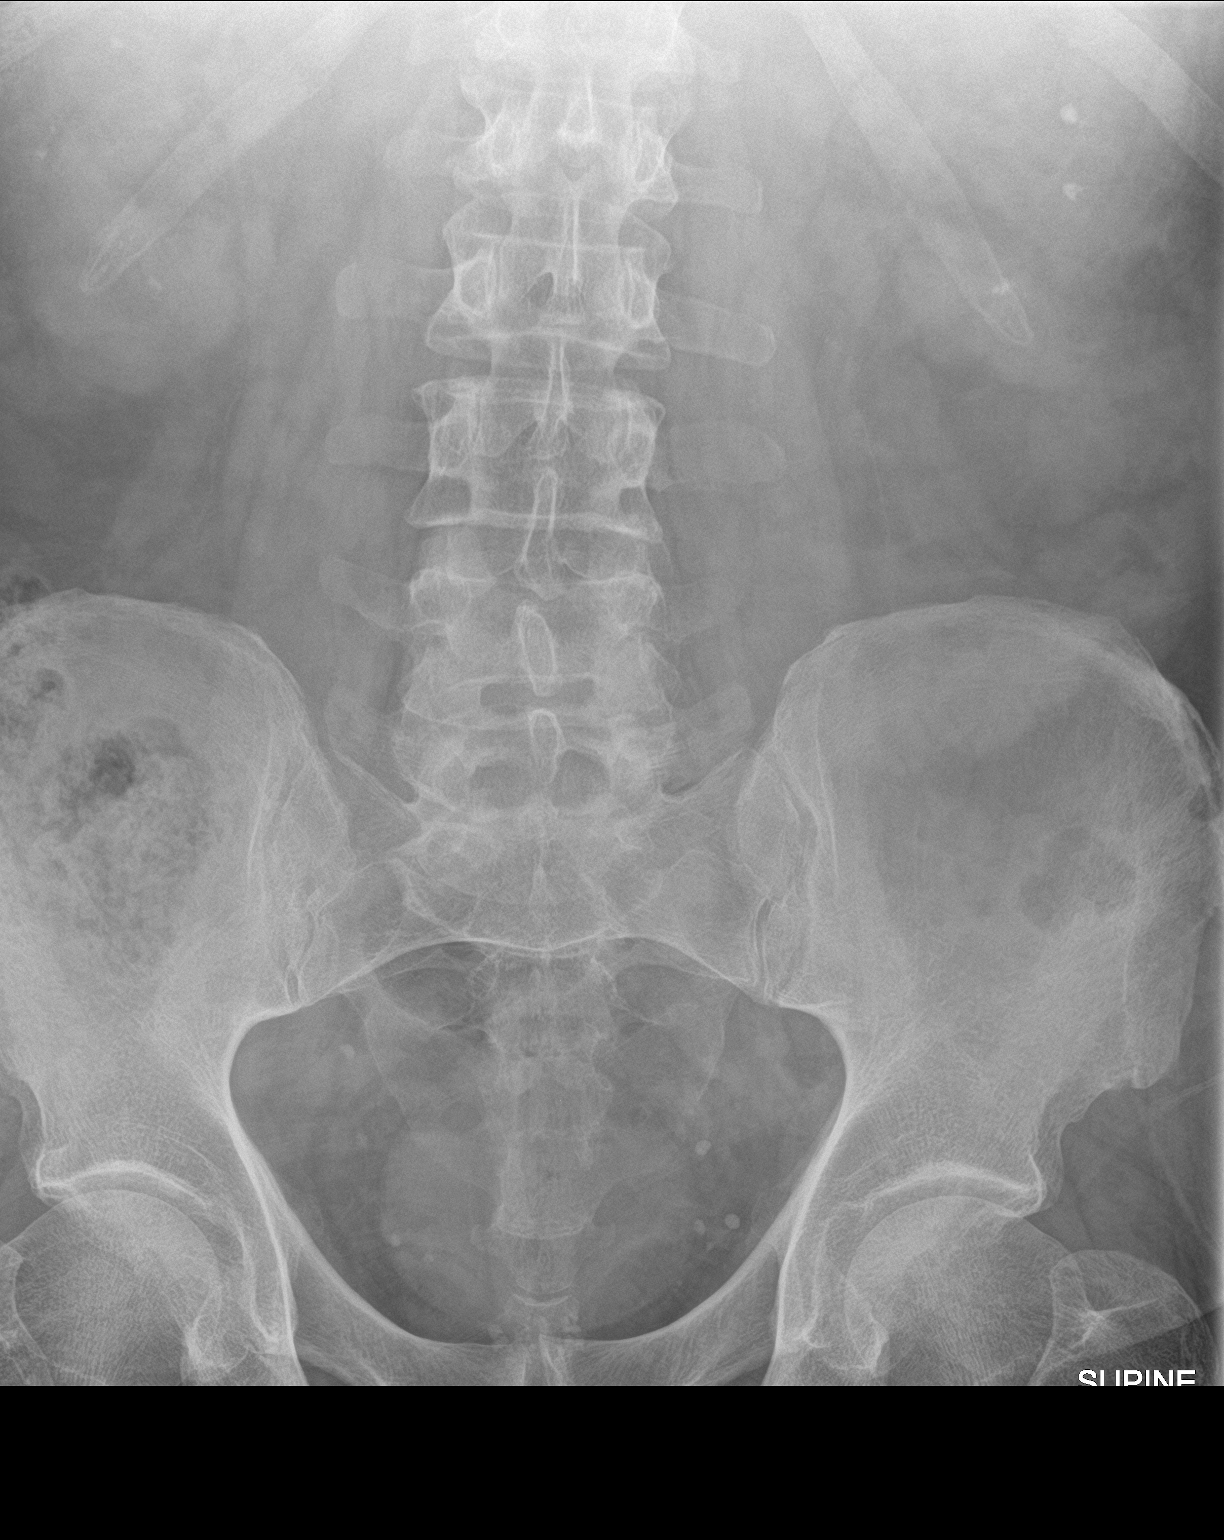
[im 2/2]
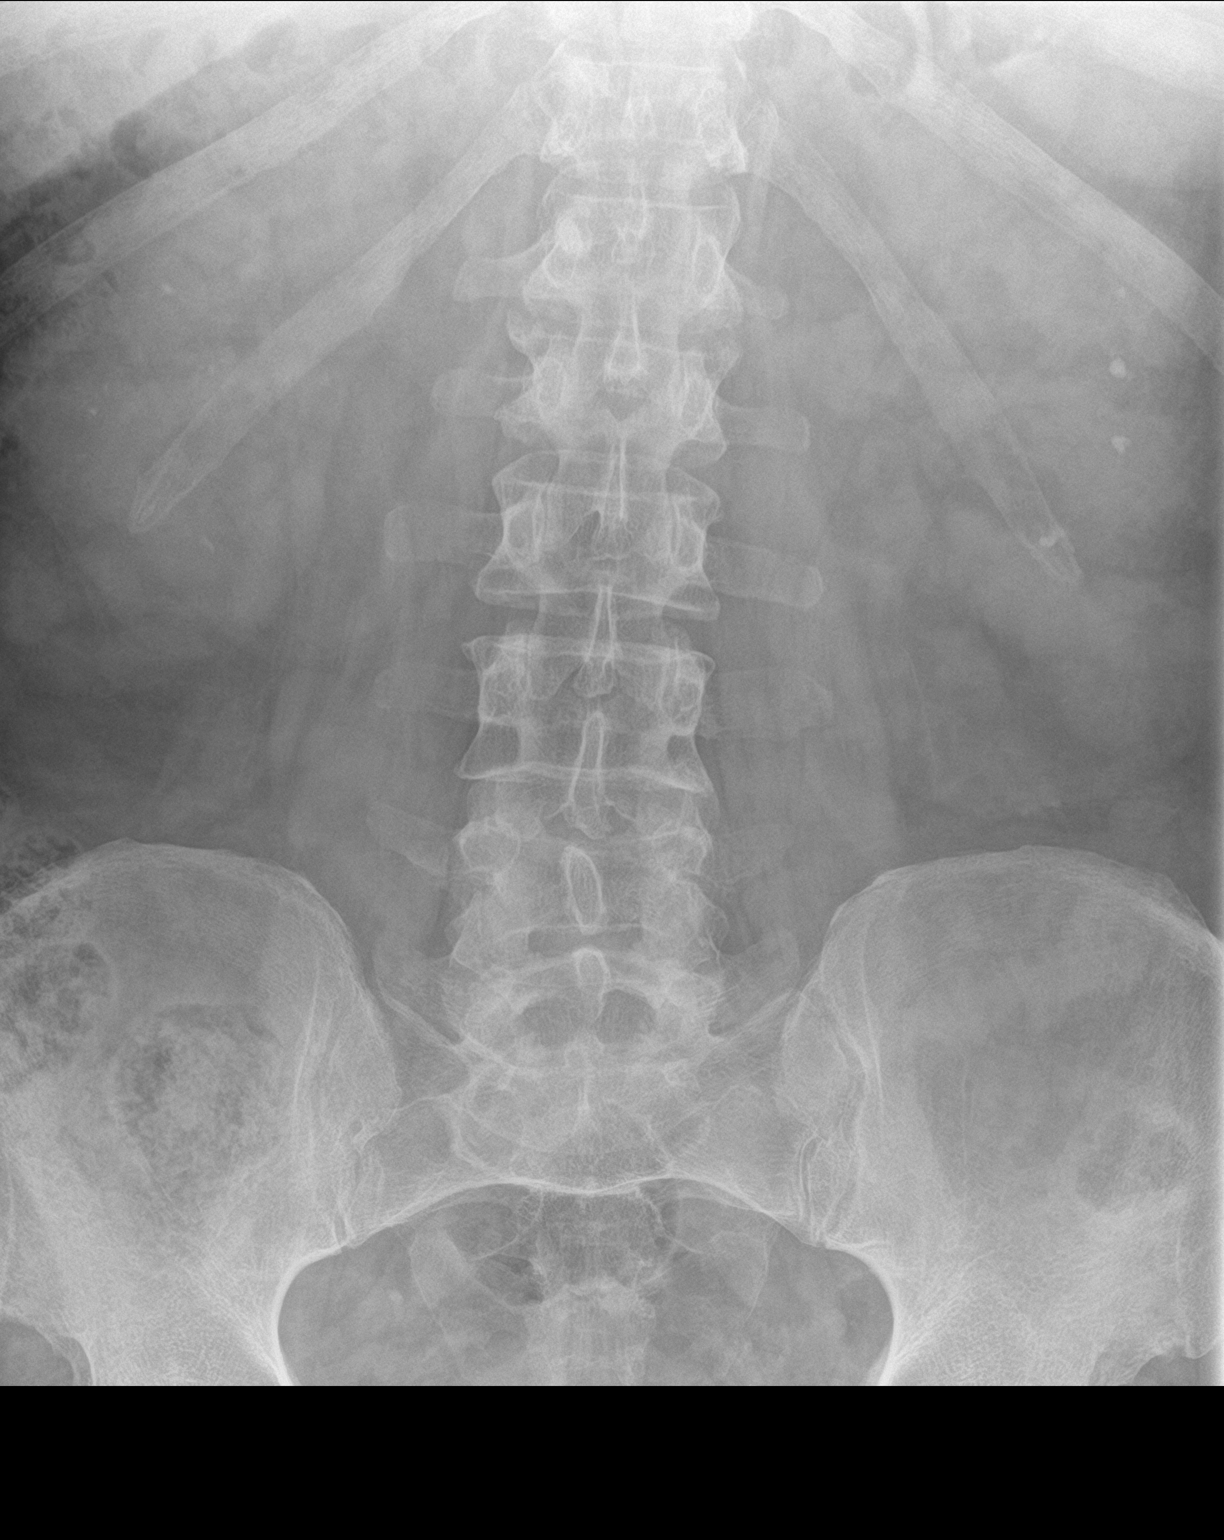

[2 of 2 positions shown; findings below may reference images not displayed]

FINDINGS: Multiple calcifications overlying both renal shadows are noted
ranging from punctate to 4 mm and compatible with nonobstructing
renal calculi.

A new 4 mm calcification overlying the RIGHT anatomic pelvis just
LATERAL to the mid sacrum is noted and indeterminate.

Other calcifications within the anatomic pelvis are unchanged and
compatible with phleboliths.

The bowel gas pattern is unremarkable.

No acute or suspicious bony abnormalities are identified.
IMPRESSION: 1. New 4 mm calcification overlying the RIGHT anatomic pelvis,
indeterminate for RIGHT distal ureteral calculus. Consider CT as
clinically indicated.
2. Bilateral renal calculi again noted.

## 2020-05-26 IMAGING — CR DG ABDOMEN 1V
1 series · 2 of 2 positions shown · non-contrast
Comparison: 05/14/2018

CLINICAL DATA: Left lower abdominal pain and hematuria since last
night. Appendectomy 1 week ago.

EXAM:
ABDOMEN - 1 VIEW

[Series 1: dg abd 1 view · 0.14mm/px · 2 of 2 slices shown]
[im 1/2]
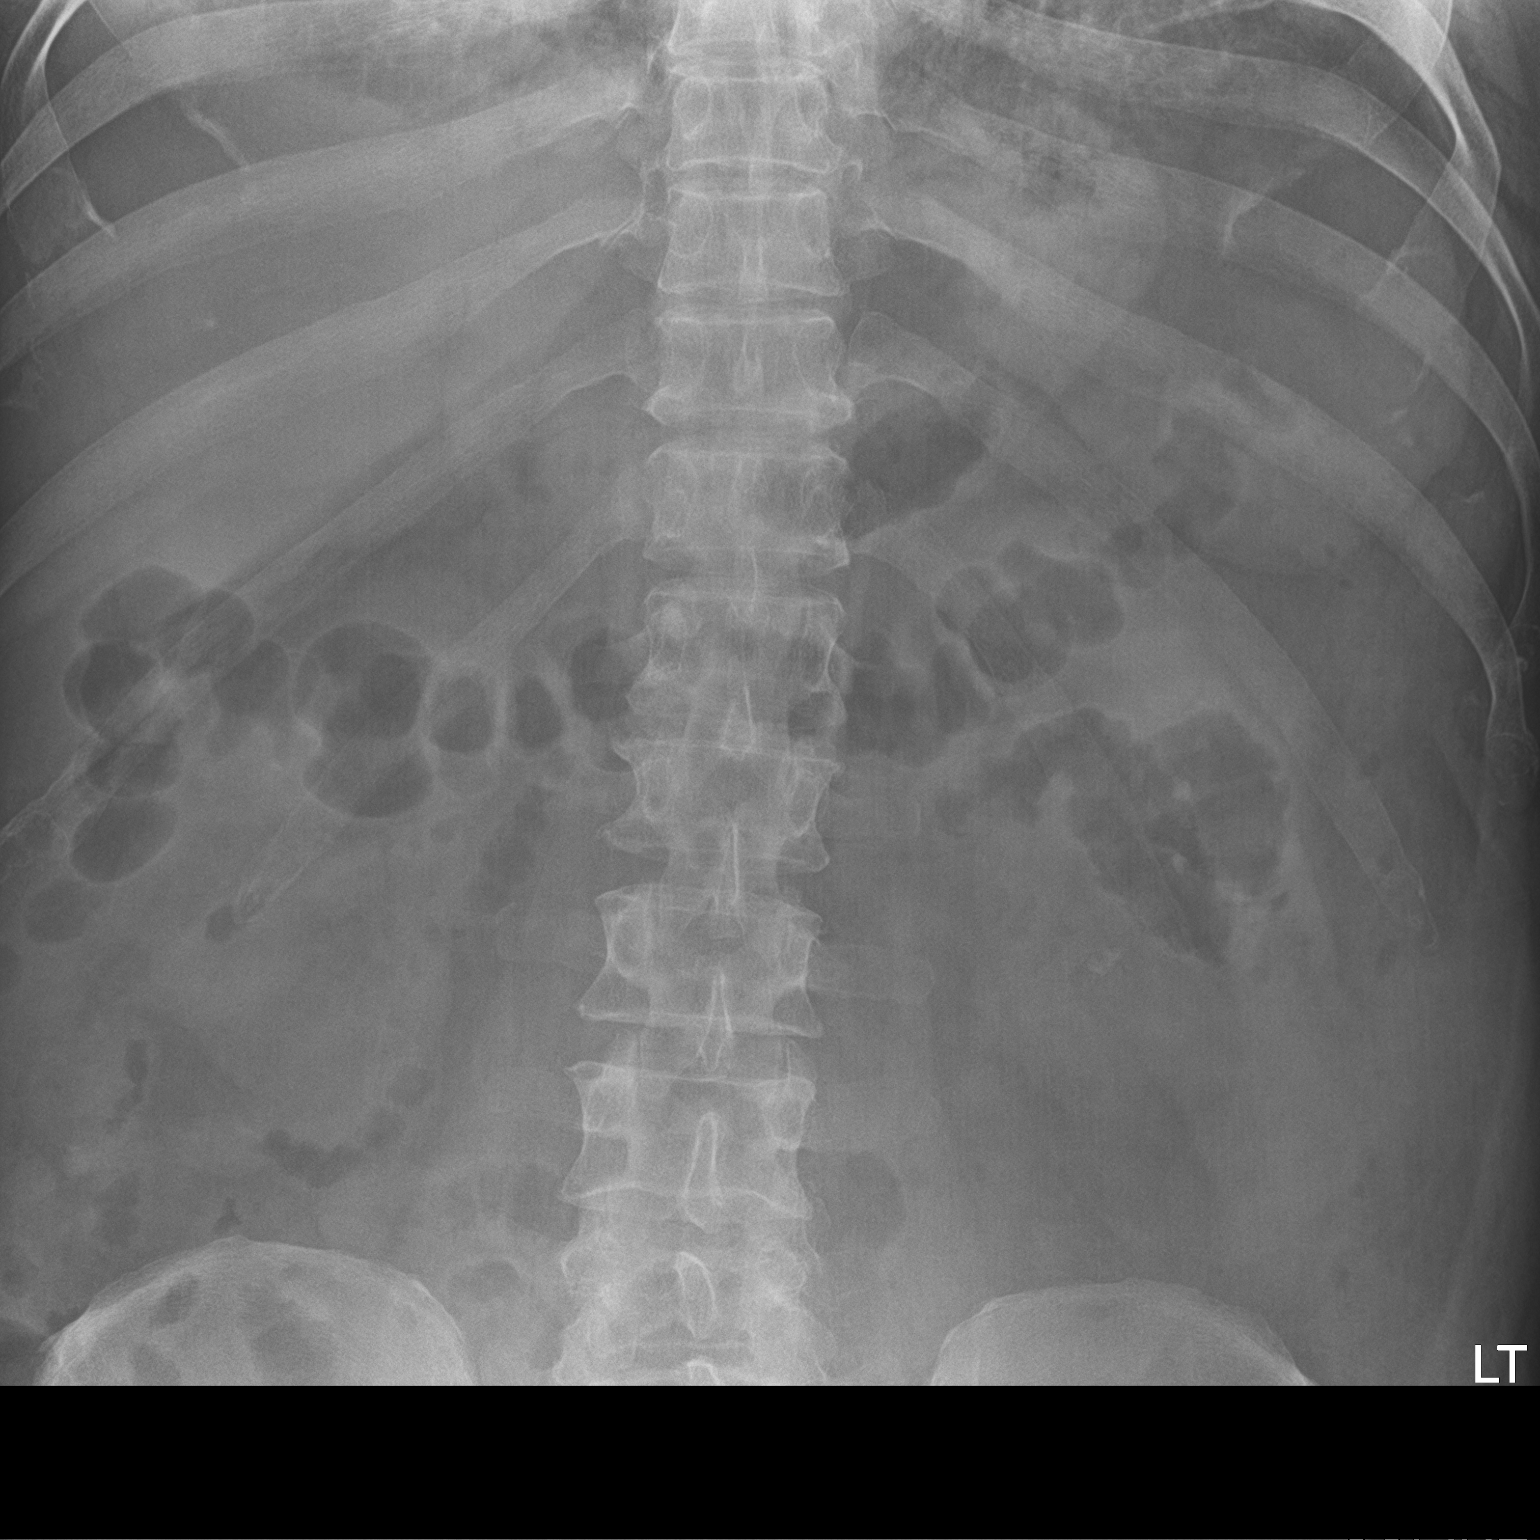
[im 2/2]
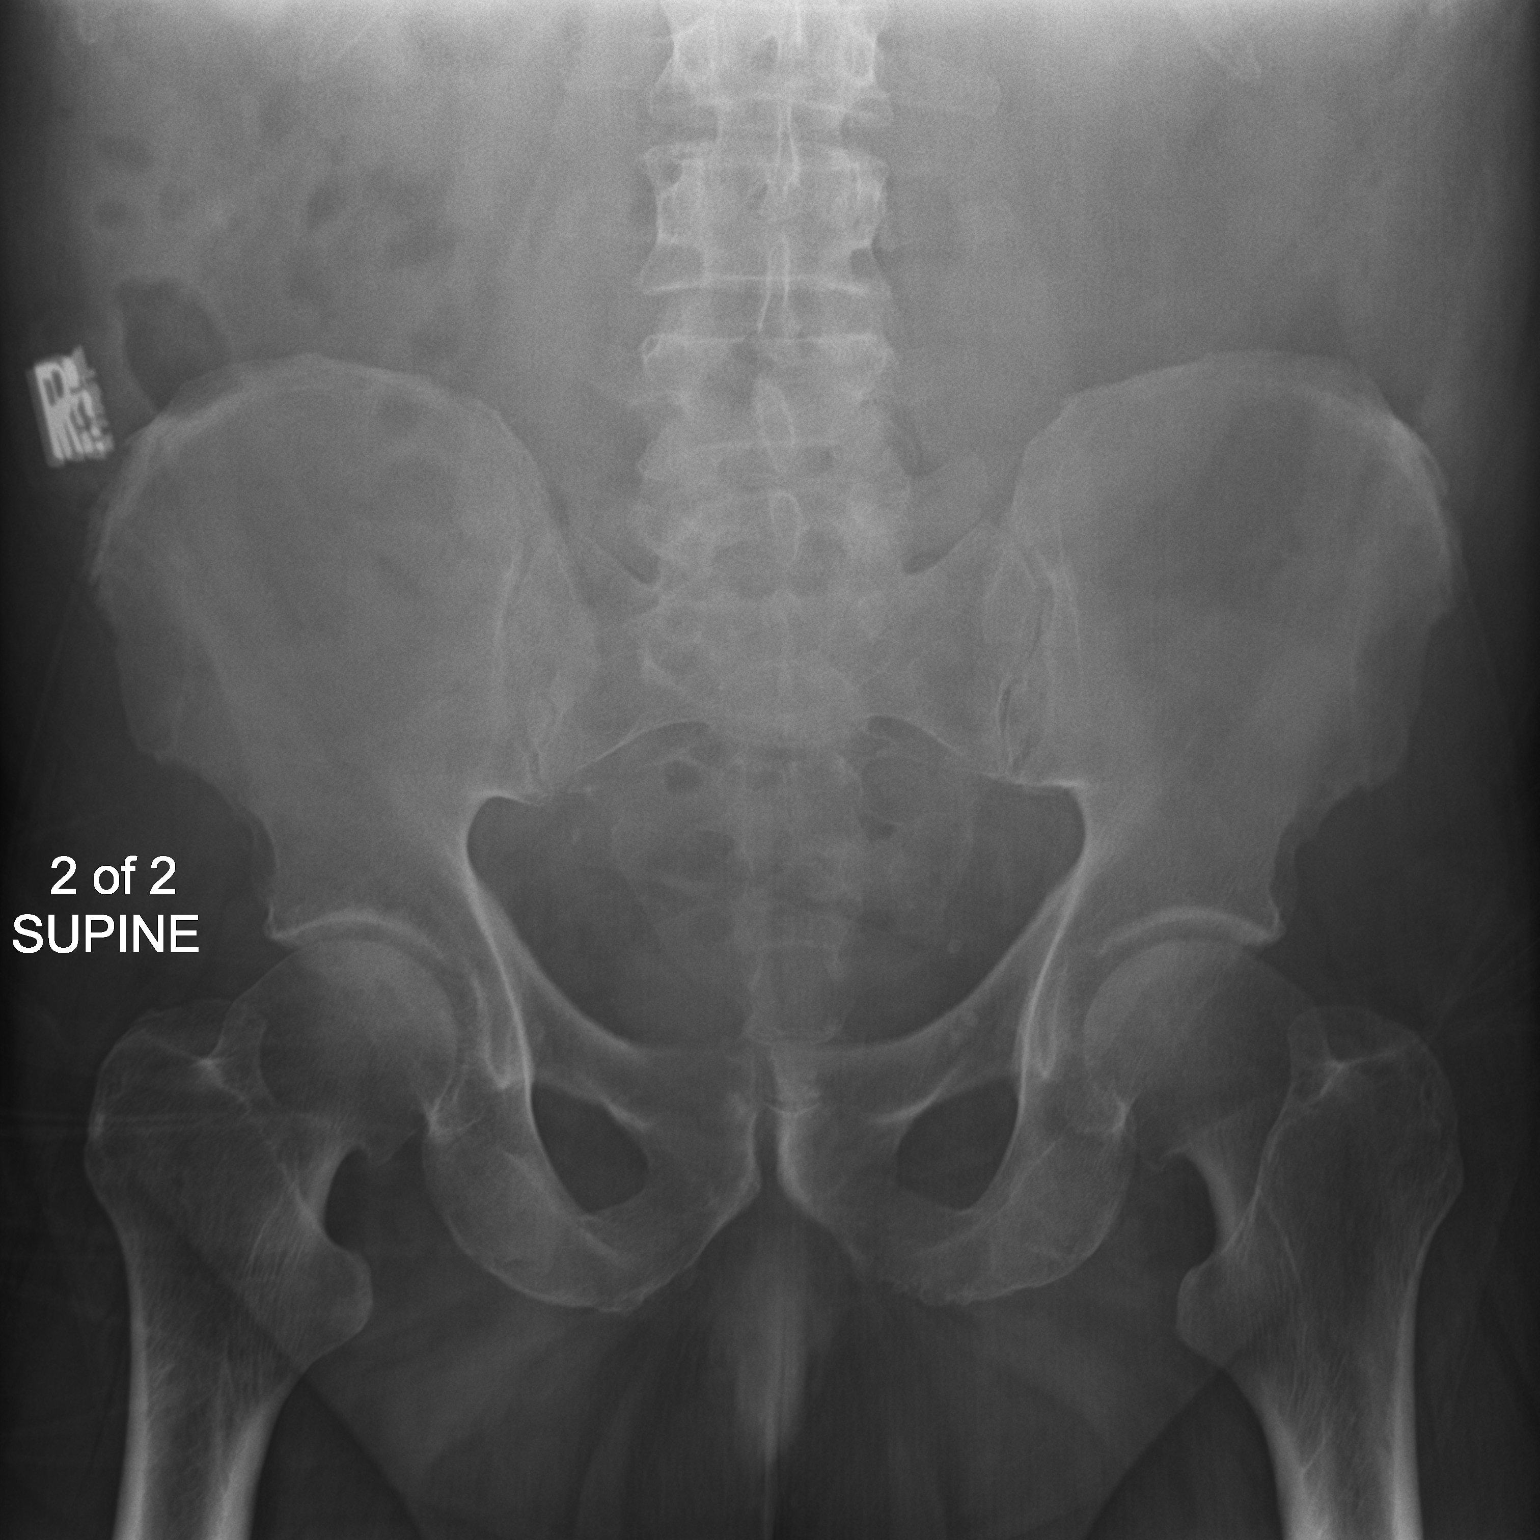

[2 of 2 positions shown; findings below may reference images not displayed]

FINDINGS: Bowel gas pattern is nonobstructive with a few nonspecific
air-filled nondilated small bowel loops present. No free peritoneal
air. Several small calcifications over the left kidney unchanged
likely renal stones. Few possible tiny calcifications over the right
kidney. Remainder the exam is unchanged.
IMPRESSION: Nonobstructive bowel gas pattern.

Suggestion of bilateral nephrolithiasis unchanged.

## 2020-06-18 ENCOUNTER — Other Ambulatory Visit: Payer: Self-pay | Admitting: Cardiovascular Disease

## 2020-08-18 MED ORDER — DIPHENHYDRAMINE HCL 25 MG PO CAPS: 25.0000 mg | ORAL_CAPSULE | Freq: Once | ORAL | Status: AC

## 2020-08-18 MED ORDER — DEXTROSE-NACL 5-0.45 % IV SOLN
INTRAVENOUS | Status: DC
Start: 2020-08-18 — End: 2020-08-19

## 2020-08-18 MED ORDER — PROMETHAZINE HCL 25 MG/ML IJ SOLN: 25.0000 mg | Freq: Once | INTRAMUSCULAR | Status: AC

## 2020-08-18 MED ORDER — LEVOFLOXACIN 500 MG PO TABS: 500.0000 mg | ORAL_TABLET | Freq: Every day | ORAL | Status: DC

## 2020-08-18 MED ORDER — MORPHINE SULFATE (PF) 10 MG/ML IV SOLN
10.0000 mg | Freq: Once | INTRAVENOUS | Status: AC
  Administered 2020-08-19: 10 mg via INTRAMUSCULAR

## 2020-08-18 MED ORDER — MIDAZOLAM HCL 2 MG/2ML IJ SOLN: 1.0000 mg | Freq: Once | INTRAMUSCULAR | Status: AC

## 2020-08-18 MED ORDER — MORPHINE SULFATE (PF) 4 MG/ML IV SOLN: 10.0000 mg | Freq: Once | INTRAVENOUS | Status: DC

## 2020-08-19 ENCOUNTER — Encounter
Admission: RE | Disposition: A | Discharge status: Home / Self Care | Payer: Self-pay | Source: Ambulatory Visit | Attending: Urology

## 2020-08-19 ENCOUNTER — Ambulatory Visit
Admission: RE | Admit: 2020-08-19 | Discharge: 2020-08-19 | Disposition: A | Discharge status: Home / Self Care | Payer: Medicare HMO | Source: Ambulatory Visit | Attending: Urology | Admitting: Urology

## 2020-08-19 DIAGNOSIS — N200 Calculus of kidney: Secondary | ICD-10-CM | POA: Insufficient documentation

## 2020-08-19 DIAGNOSIS — Z79899 Other long term (current) drug therapy: Secondary | ICD-10-CM | POA: Diagnosis not present

## 2020-08-19 DIAGNOSIS — I4891 Unspecified atrial fibrillation: Secondary | ICD-10-CM | POA: Diagnosis not present

## 2020-08-19 DIAGNOSIS — E119 Type 2 diabetes mellitus without complications: Secondary | ICD-10-CM | POA: Insufficient documentation

## 2020-08-19 HISTORY — PX: EXTRACORPOREAL SHOCK WAVE LITHOTRIPSY: SHX1557

## 2020-08-19 SURGERY — LITHOTRIPSY, ESWL
Anesthesia: Moderate Sedation | Laterality: Left

## 2020-08-19 MED ORDER — TAMSULOSIN HCL 0.4 MG PO CAPS: 0.4000 mg | ORAL_CAPSULE | Freq: Every day | ORAL | 11 refills | Status: DC

## 2020-08-19 MED ORDER — SODIUM CHLORIDE FLUSH 0.9 % IV SOLN
INTRAVENOUS | Status: AC
  Filled 2020-08-19: qty 10

## 2020-08-19 MED ORDER — PROMETHAZINE HCL 25 MG/ML IJ SOLN
INTRAMUSCULAR | Status: AC
  Administered 2020-08-19: 25 mg via INTRAMUSCULAR
  Filled 2020-08-19: qty 1

## 2020-08-19 MED ORDER — CIPROFLOXACIN HCL 500 MG PO TABS: 500.0000 mg | ORAL_TABLET | Freq: Two times a day (BID) | ORAL | 0 refills | Status: DC

## 2020-08-19 MED ORDER — FUROSEMIDE 10 MG/ML IJ SOLN
INTRAMUSCULAR | Status: AC
  Administered 2020-08-19: 10 mg via INTRAVENOUS
  Filled 2020-08-19: qty 2

## 2020-08-19 MED ORDER — SCOPOLAMINE 1 MG/3DAYS TD PT72
MEDICATED_PATCH | TRANSDERMAL | Status: AC
  Filled 2020-08-19: qty 1

## 2020-08-19 MED ORDER — LEVOFLOXACIN 500 MG PO TABS
ORAL_TABLET | ORAL | Status: AC
  Administered 2020-08-19: 500 mg via ORAL
  Filled 2020-08-19: qty 1

## 2020-08-19 MED ORDER — ONDANSETRON 8 MG PO TBDP: 8.0000 mg | ORAL_TABLET | Freq: Four times a day (QID) | ORAL | 3 refills | Status: DC | PRN

## 2020-08-19 MED ORDER — ACETAMINOPHEN-CODEINE #3 300-30 MG PO TABS: 1.0000 | ORAL_TABLET | ORAL | 1 refills | Status: DC | PRN

## 2020-08-19 MED ORDER — DOCUSATE SODIUM 100 MG PO CAPS: 200.0000 mg | ORAL_CAPSULE | Freq: Two times a day (BID) | ORAL | 3 refills | Status: DC

## 2020-08-19 MED ORDER — FUROSEMIDE 10 MG/ML IJ SOLN: 10.0000 mg | Freq: Once | INTRAMUSCULAR | Status: AC

## 2020-08-19 MED ORDER — DIPHENHYDRAMINE HCL 25 MG PO CAPS
ORAL_CAPSULE | ORAL | Status: AC
  Administered 2020-08-19: 25 mg via ORAL
  Filled 2020-08-19: qty 1

## 2020-08-19 MED ORDER — MIDAZOLAM HCL 2 MG/2ML IJ SOLN
INTRAMUSCULAR | Status: AC
  Administered 2020-08-19: 1 mg via INTRAMUSCULAR
  Filled 2020-08-19: qty 2

## 2020-08-19 NOTE — Discharge Instructions (Addendum)
AMBULATORY SURGERY  DISCHARGE INSTRUCTIONS   1) The drugs that you were given will stay in your system until tomorrow so for the next 24 hours you should not:  A) Drive an automobile B) Make any legal decisions C) Drink any alcoholic beverage   2) You may resume regular meals tomorrow.  Today it is better to start with liquids and gradually work up to solid foods.  You may eat anything you prefer, but it is better to start with liquids, then soup and crackers, and gradually work up to solid foods.   3) Please notify your doctor immediately if you have any unusual bleeding, trouble breathing, redness and pain at the surgery site, drainage, fever, or pain not relieved by medication.    4) Additional Instructions:        Please contact your physician with any problems or Same Day Surgery at [phone], Monday through Friday 6 am to 4 pm, or [place] [facility] number at [phone].Lithotripsy, Care After This sheet gives you information about how to care for yourself after your procedure. Your health care provider may also give you more specific instructions. If you have problems or questions, contact your health care provider. What can I expect after the procedure? After the procedure, it is common to have:  Some blood in your urine. This should only last for a few days.  Soreness in your back, sides, or upper abdomen for a few days.  Blotches or bruises on the area where the shock wave entered the skin.  Pain, discomfort, or nausea when pieces (fragments) of the kidney stone move through the tube that carries urine from the kidney to the bladder (ureter). Stone fragments may pass soon after the procedure, but they may continue to pass for up to 4-8 weeks. ? If you have severe pain or nausea, contact your health care provider. This may be caused by a large stone that was not broken up, and this may mean that you need more treatment.  Some pain or discomfort during  urination.  Some pain or discomfort in the lower abdomen or (in men) at the base of the penis. Follow these instructions at home: Medicines  Take over-the-counter and prescription medicines only as told by your health care provider.  If you were prescribed an antibiotic medicine, take it as told by your health care provider. Do not stop taking the antibiotic even if you start to feel better.  Ask your health care provider if the medicine prescribed to you requires you to avoid driving or using machinery. Eating and drinking  Drink enough fluid to keep your urine pale yellow. This helps any remaining pieces of the stone to pass. It can also help prevent new stones from forming.  Eat plenty of fresh fruits and vegetables.  Follow instructions from your health care provider about eating or drinking restrictions. You may be instructed to: ? Reduce how much salt (sodium) you eat or drink. Check ingredients and nutrition facts on packaged foods and beverages to see how much sodium they contain. ? Reduce how much meat you eat.  Eat the recommended amount of calcium for your age and gender. Ask your health care provider how much calcium you should have.      General instructions  Get plenty of rest.  Return to your normal activities as told by your health care provider. Ask your health care provider what activities are safe for you. Most people can resume normal activities 1-2 days after  the procedure.  If you were given a sedative during the procedure, it can affect you for several hours. Do not drive or operate machinery until your health care provider says that it is safe.  Your health care provider may direct you to lie in a certain position (postural drainage) and tap firmly (percuss) over your kidney area to help stone fragments pass. Follow instructions as told by your health care provider.  If directed, strain all urine through the strainer that was provided by your health care  provider. ? Keep all fragments for your health care provider to see. Any stones that are found may be sent to a medical lab for examination. The stone may be as small as a grain of salt.  Keep all follow-up visits as told by your health care provider. This is important. Contact a health care provider if:  You have a fever or chills.  You have nausea that is severe or does not go away.  You have any of these urinary symptoms: ? Blood in your urine for longer than your health care provider told you to expect. ? Urine that smells bad or unusual. ? Feeling a strong urge to urinate after emptying your bladder. ? Pain or burning with urination that does not go away. ? Urinating more often than usual and this does not go away.  You have a stent and it comes out. Get help right away if:  You have severe pain in your back, sides, or upper abdomen.  You have any of these urinary symptoms: ? Severe pain while urinating. ? More blood in your urine or having blood in your urine when you did not before. ? Passing blood clots in your urine. ? Passing only a small amount of urine or being unable to pass any urine at all.  You have severe nausea that leads to persistent vomiting.  You faint. Summary  After this procedure, it is common to have some pain, discomfort, or nausea when pieces (fragments) of the kidney stone move through the tube that carries urine from the kidney to the bladder (ureter). If this pain or nausea is severe, however, you should contact your health care provider.  Return to your normal activities as told by your health care provider. Ask your health care provider what activities are safe for you.  Drink enough fluid to keep your urine pale yellow. This helps any remaining pieces of the stone to pass, and it can help prevent new stones from forming.  If directed, strain your urine and keep all fragments for your health care provider to see. Fragments or stones may be as  small as a grain of salt.  Get help right away if you have severe pain in your back, sides, or upper abdomen, or if you have severe pain while urinating. This information is not intended to replace advice given to you by your health care provider. Make sure you discuss any questions you have with your health care provider. Document Revised: 01/22/2019 Document Reviewed: 01/22/2019 Elsevier Patient Education  2021 Elsevier Inc.  

## 2020-08-20 ENCOUNTER — Encounter: Payer: Self-pay | Admitting: Urology

## 2020-09-27 ENCOUNTER — Other Ambulatory Visit: Payer: Self-pay | Admitting: General Surgery

## 2020-09-27 NOTE — Progress Notes (Signed)
Subjective:     Patient ID: Marcus Bryant is a 65 y.o. male.  HPI  The following portions of the patient's history were reviewed and updated as appropriate.  This an established patient is here today for: office visit. The patient is here today for re-evaluation of an umbilical hernia.  The patient was first seen on October 07, 2019 at a prior office in regards to a mildly symptomatic umbilical hernia post diagnostic laparoscopy and peritoneal lavage for perforated appendix.  The hernia was minimally symptomatic at that time and it was felt important for the patient to have a screening colonoscopy as an incidental/interval appendectomy not been completed.  This was subsequently completed at [facility] on August 22, 2019 through with the identification of hyperplastic polyps.  The patient is seen now in regards to the opportunities available for hernia repair.  At the time of his evaluation he was noted to have mild diastases recti which was asymptomatic.     Chief Complaint  Patient presents with  . Hernia     BP 122/72   Pulse 57   Temp 36.7 C (98 F)   Ht 180.3 cm (_0 )   Wt (!) 110.7 kg (244 lb)   SpO2 97%   BMI 34.03 kg/m       Past Medical History:  Diagnosis Date  . Acute appendicitis with generalized peritonitis, without gangrene or abscess 05/26/2018   Diagnostic lavage, no appendectomy.  . Atrial fibrillation (CMS-HCC)    intermittent - episodes 6/15 and 11/16  . Calculus of kidney 01/24/2012  . ED (erectile dysfunction)   . Enlarged prostate with lower urinary tract symptoms (LUTS) 04/30/2014  . Hyperlipidemia   . 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 Male hypogonadism   . Obesity 09/26/2013   Last Assessment & Plan:  We have encouraged continued exercise, careful diet management in an effort to lose weight.   . Obstructive sleep apnea 09/26/2013   Last Assessment & Plan:  History of obstructive sleep apnea, sleeps on his stomach, did not tolerate  CPAP. This would be a risk factor for atrial fibrillation   . Sleep apnea   . Urolithiasis           Past Surgical History:  Procedure Laterality Date  . COLONOSCOPY  10/31/2002   [place] (Father): CBF 10/2007; Recall Ltr mailed 06/24/2014 (dw)  . COLONOSCOPY  12/22/2019   [facility].  Hyperplastic polyps identified in the sigmoid and descending colon.  Marland Kitchen LITHOTRIPSY  07/2020  . S/P kidney stones     x 2 10/01, 04/02 with lithotripsy on 11/05       Social History     Socioeconomic History  . Marital status: Divorced  Tobacco Use  . Smoking status: Never Smoker  . Smokeless tobacco: Never Used  Vaping Use  . Vaping Use: Never used  Substance and Sexual Activity  . Alcohol use: No    Alcohol/week: 0.0 standard drinks  . Drug use: No       No Known Allergies  Current Medications        Current Outpatient Medications  Medication Sig Dispense Refill  . aspirin 81 MG EC tablet Take 81 mg by mouth once daily.    Marland Kitchen b complex vitamins capsule Take 1 capsule by mouth once daily       . KRILL OIL ORAL Take 1 capsule by mouth once daily.    . metoprolol succinate (TOPROL-XL) 25 MG XL tablet Take 25 mg by mouth once  daily.  11  . simvastatin (ZOCOR) 80 MG tablet TAKE 1/2 TABLET BY MOUTH EVERY DAY 45 tablet 3  . tamsulosin (FLOMAX) 0.4 mg capsule Take 0.4 mg by mouth as needed.      Marland Kitchen albuterol (VENTOLIN HFA) 90 mcg/actuation inhaler Inhale 2 inhalations into the lungs every 6 (six) hours as needed (Patient not taking: No sig reported) 1 each 0  . oxyCODONE-acetaminophen (PERCOCET) 5-325 mg tablet Take 1 tablet by mouth as needed    (Patient not taking: No sig reported)  0  . tadalafiL (CIALIS) 20 MG tablet Take 1 tablet (20 mg total) by mouth once daily as needed 10 tablet 2   No current facility-administered medications for this visit.           Family History  Problem Relation Age of Onset  . High blood pressure (Hypertension) Mother    . Myocardial Infarction (Heart attack) Father   . Breast cancer Neg Hx   . Colon cancer Neg Hx     Review of Systems  Constitutional: Negative for chills and fever.  Respiratory: Negative for cough.        Objective:   Physical Exam Constitutional:      Appearance: Normal appearance.  Cardiovascular:     Rate and Rhythm: Normal rate and regular rhythm.     Pulses: Normal pulses.     Heart sounds: Normal heart sounds.  Pulmonary:     Effort: Pulmonary effort is normal.     Breath sounds: Normal breath sounds.  Abdominal:     Hernia: A hernia is present. Hernia is present in the umbilical area (one centimeter defect).    Neurological:     Mental Status: He is alert and oriented to person, place, and time.  Psychiatric:        Mood and Affect: Mood normal.        Behavior: Behavior normal.     Labs and Radiology:   [facility] colonoscopy report and pathology for December 22, 2019 were independently reviewed.  May 25, 2020 laboratory:  Hemoglobin A1C 4.2 - 5.6 % 7.1High    Glucose 70 - 110 mg/dL 106   Sodium 136 - 145 mmol/L 140   Potassium 3.6 - 5.1 mmol/L 4.4   Chloride 97 - 109 mmol/L 103   Carbon Dioxide (CO2) 22.0 - 32.0 mmol/L 32.6High   Urea Nitrogen (BUN) 7 - 25 mg/dL 20   Creatinine 0.7 - 1.3 mg/dL 0.8   Glomerular Filtration Rate (eGFR), MDRD Estimate >60 mL/min/1.73sq m 97   Calcium 8.7 - 10.3 mg/dL 9.4   AST  8 - 39 U/L 49High   ALT  6 - 57 U/L 32   Alk Phos (alkaline Phosphatase) 34 - 104 U/L 73   Albumin 3.5 - 4.8 g/dL 3.8   Bilirubin, Total 0.3 - 1.2 mg/dL 0.9   Protein, Total 6.1 - 7.9 g/dL 7.2   A/G Ratio 1.0 - 5.0 gm/dL 1.1    Cardiology evaluation of April 06, 2020:  ASSESSMENT AND PLAN:  Atrial fibrillation, unspecified type [facility])-  Paroxysmal episodes, emergency room October 03, 2019 Continue metoprolol succinate 25 mg daily Rare epsiodes, CHADS VASC 0 Suggested he takemetoprolol tartarte/ add flecainide 100 mg  x 1 Kardiomobile to monitor rhythm  Hyperlipidemia We have encouraged continued exercise, careful diet management in an effort to lose weight.  Obstructive sleep apnea Unable to tolerate CPAP, sleeps in stomach Possible trigger for atrial fib Discussed with him again, we will not order another sleepstudy  Ida Rogue, MD    Assessment:     Mildly symptomatic umbilical hernia.  Intermittent atrial fibrillation not presently requiring anticoagulant.  Sleep apnea, patient aware of increased risk with recurrent atrial fibrillation with persistence.     Plan:     Indications for repair were reviewed.  Potential role for prosthetic mesh if the size is significantly different than that noted on clinical exam.  Role for aggressive management of weight and reevaluation for sleep apnea.  The patient had been unable to make use of a full facemask reporting he was a "prone sleeper".  He has been encouraged to wear this by his cardiologist.  We reviewed right-sided hypertension and potential for heart failure if this goes uncorrected.  The patient will contact the office regarding a convenient date for surgery.      This note is partially prepared by Ledell Noss, CMA acting as a scribe in the presence of Dr. Hervey Ard, MD.   The documentation recorded by the scribe accurately reflects the service I personally performed and the decisions made by me.   Robert Bellow, MD FACS

## 2020-10-19 ENCOUNTER — Encounter: Payer: Self-pay | Admitting: General Surgery

## 2020-10-19 ENCOUNTER — Other Ambulatory Visit
Admission: RE | Admit: 2020-10-19 | Discharge: 2020-10-19 | Disposition: A | Discharge status: Home / Self Care | Payer: Medicare HMO | Source: Ambulatory Visit | Attending: General Surgery | Admitting: General Surgery

## 2020-10-19 ENCOUNTER — Other Ambulatory Visit: Payer: Self-pay

## 2020-10-19 HISTORY — DX: Personal history of urinary calculi: Z87.442

## 2020-10-19 HISTORY — DX: Other complications of anesthesia, initial encounter: T88.59XA

## 2020-10-19 HISTORY — DX: Sleep apnea, unspecified: G47.30

## 2020-10-19 HISTORY — DX: Unspecified osteoarthritis, unspecified site: M19.90

## 2020-10-19 HISTORY — DX: Nausea with vomiting, unspecified: R11.2

## 2020-10-19 NOTE — Patient Instructions (Addendum)
Your procedure is scheduled on: 11/10/20 - Wednesday Report to the Registration Desk on the 1st floor of the [facility]. To find out your arrival time, please call [phone] between 1PM - 3PM on: 11/09/20 - Tuesday Report to [facility] on Monday - 11/08/20 at 8 :00 am for Labs and EKG.  REMEMBER: Instructions that are not followed completely may result in serious medical risk, up to and including death; or upon the discretion of your surgeon and anesthesiologist your surgery may need to be rescheduled.  Do not eat food after midnight the night before surgery.  No gum chewing, lozengers or hard candies.  You may however, drink CLEAR liquids up to 2 hours before you are scheduled to arrive for your surgery. Do not drink anything within 2 hours of your scheduled arrival time.  Clear liquids include: - water  - apple juice without pulp - gatorade (not RED, PURPLE, OR BLUE) - black coffee or tea (Do NOT add milk or creamers to the coffee or tea) Do NOT drink anything that is not on this list.  TAKE THESE MEDICATIONS THE MORNING OF SURGERY WITH A SIP OF WATER: NONE  Follow recommendations from Cardiologist, Pulmonologist or PCP regarding stopping Aspirin, Coumadin, Plavix, Eliquis, Pradaxa, or Pletal.  One week prior to surgery: Stop Anti-inflammatories (NSAIDS) such as Advil, Aleve, Ibuprofen, Motrin, Naproxen, Naprosyn and Aspirin based products such as Excedrin, Goodys Powder, BC Powder.  Stop 1 week prior to your surgery, ANY OVER THE COUNTER supplements until after surgery.  You may take Tylenol if needed for pain up until the day of surgery.  No Alcohol for 24 hours before or after surgery.  No Smoking including e-cigarettes for 24 hours prior to surgery.  No chewable tobacco products for at least 6 hours prior to surgery.  No nicotine patches on the day of surgery.  Do not use any "recreational" drugs for at least a week prior to your surgery.  Please be advised that  the combination of cocaine and anesthesia may have negative outcomes, up to and including death. If you test positive for cocaine, your surgery will be cancelled.  On the morning of surgery brush your teeth with toothpaste and water, you may rinse your mouth with mouthwash if you wish. Do not swallow any toothpaste or mouthwash.  Do not wear jewelry, make-up, hairpins, clips or nail polish.  Do not wear lotions, powders, or perfumes.   Do not shave body from the neck down 48 hours prior to surgery just in case you cut yourself which could leave a site for infection.  Also, freshly shaved skin may become irritated if using the CHG soap.  Contact lenses, hearing aids and dentures may not be worn into surgery.  Do not bring valuables to the hospital. [facility] is not responsible for any missing/lost belongings or valuables.   Use CHG Soap or wipes as directed on instruction sheet.  Notify your doctor if there is any change in your medical condition (cold, fever, infection).  Wear comfortable clothing (specific to your surgery type) to the hospital.  After surgery, you can help prevent lung complications by doing breathing exercises.  Take deep breaths and cough every 1-2 hours. Your doctor may order a device called an Incentive Spirometer to help you take deep breaths. When coughing or sneezing, hold a pillow firmly against your incision with both hands. This is called "splinting." Doing this helps protect your incision. It also decreases belly discomfort.  If you are  being admitted to the hospital overnight, leave your suitcase in the car. After surgery it may be brought to your room.  If you are being discharged the day of surgery, you will not be allowed to drive home. You will need a responsible adult (18 years or older) to drive you home and stay with you that night.   If you are taking public transportation, you will need to have a responsible adult (18 years or older) with  you. Please confirm with your physician that it is acceptable to use public transportation.   Please call the Pre-admissions Testing Dept. at [phone] if you have any questions about these instructions.  Surgery Visitation Policy:  Patients undergoing a surgery or procedure may have one family member or support person with them as long as that person is not COVID-19 positive or experiencing its symptoms.  That person may remain in the waiting area during the procedure.  Inpatient Visitation:    Visiting hours are 7 a.m. to 8 p.m. Inpatients will be allowed two visitors daily. The visitors may change each day during the patient's stay. No visitors under the age of 44. Any visitor under the age of 68 must be accompanied by an adult. The visitor must pass COVID-19 screenings, use hand sanitizer when entering and exiting the patient's room and wear a mask at all times, including in the patient's room. Patients must also wear a mask when staff or their visitor are in the room. Masking is required regardless of vaccination status.

## 2020-10-19 NOTE — Pre-Procedure Instructions (Signed)
Patient reports extreme nausea and vomiting after sedation/anesthesia, he prefers scopolamine patch as he says this is effective for his symptoms.

## 2020-11-02 NOTE — H&P (Signed)
Subjective:     Patient ID: Marcus Bryant is a 65 y.o. male.   HPI   The following portions of the patient's history were reviewed and updated as appropriate.   This an established patient is here today for: office visit. Here for his pre operative visit, umbilical hernia for 11-10-20.     Review of Systems  Constitutional: Negative for chills and fever.  Respiratory: Negative for cough.          Chief Complaint  Patient presents with   Pre-op Exam      BP 122/60   Pulse 56   Temp 36.2 C (97.2 F)   Ht 182.9 cm (6')   Wt (!) 111.6 kg (246 lb)   SpO2 95%   BMI 33.36 kg/m        Past Medical History:  Diagnosis Date   Acute appendicitis with generalized peritonitis, without gangrene or abscess 05/26/2018    Diagnostic lavage, no appendectomy.   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    Obstructive sleep apnea 09/26/2013    Last Assessment & Plan:  History of obstructive sleep apnea, sleeps on his stomach, did not tolerate CPAP. This would be a risk factor for atrial fibrillation    Sleep apnea     Urolithiasis             Past Surgical History:  Procedure Laterality Date   COLONOSCOPY   10/31/2002    [facility] (Father): CBF 10/2007; Recall Ltr mailed 06/24/2014 (dw)   COLONOSCOPY   12/22/2019    [facility].  Hyperplastic polyps identified in the sigmoid and descending colon.   LITHOTRIPSY   07/2020   S/P kidney stones        x 2 10/01, 04/02 with lithotripsy on 11/05          Social History           Socioeconomic History   Marital status: Divorced  Tobacco Use    Smoking status: Never Smoker   Smokeless tobacco: Never Used  Building services engineer Use: Never used  Substance and Sexual Activity   Alcohol use: No      Alcohol/week: 0.0 standard drinks   Drug use: No        No Known Allergies   Current Medications        Current Outpatient Medications  Medication Sig Dispense Refill   aspirin 81 MG EC tablet Take 81 mg by mouth once daily.       b complex vitamins capsule Take 1 capsule by mouth once daily          KRILL OIL ORAL Take 1 capsule by mouth once daily.       metoprolol succinate (TOPROL-XL) 25 MG XL tablet Take 25 mg by mouth once daily.   11   simvastatin (ZOCOR) 80 MG tablet TAKE 1/2 TABLET BY MOUTH EVERY DAY 45 tablet 3   tadalafiL (CIALIS) 20 MG tablet Take 1 tablet (20 mg total) by mouth once daily as needed 10 tablet 2  tamsulosin (FLOMAX) 0.4 mg capsule Take 0.4 mg by mouth as needed.          albuterol (VENTOLIN HFA) 90 mcg/actuation inhaler Inhale 2 inhalations into the lungs every 6 (six) hours as needed (Patient not taking: No sig reported) 1 each 0   oxyCODONE-acetaminophen (PERCOCET) 5-325 mg tablet Take 1 tablet by mouth as needed    (Patient not taking: No sig reported)   0    No current facility-administered medications for this visit.             Family History  Problem Relation Age of Onset   High blood pressure (Hypertension) Mother     Myocardial Infarction (Heart attack) Father     Breast cancer Neg Hx     Colon cancer Neg Hx               Objective:   Physical Exam Constitutional:      Appearance: Normal appearance.  Cardiovascular:     Rate and Rhythm: Normal rate and regular rhythm.     Pulses: Normal pulses.     Heart sounds: Normal heart sounds.  Pulmonary:     Effort: Pulmonary effort is normal.     Breath sounds: Normal breath sounds.  Abdominal:     Hernia: A hernia is present. Hernia is present in the umbilical area (modest increase to 1.5 cm diameter, tender to touch. ).   Musculoskeletal:     Cervical back: Neck supple.  Skin:    General: Skin is warm and dry.  Neurological:     Mental Status: He is alert and oriented to person, place, and time.  Psychiatric:        Behavior: Behavior normal.           Assessment:     Symptomatic umbilical hernia.   Postoperative nausea and vomiting alleviated with scopolamine patch.   History of profound nausea and occasional vomiting with frequent kidney stones.   Confusion regarding living will questions from preadmission testing.    Plan:     The patient has made use of a scopolamine patch the morning of procedures with good result.  He will speak to the anesthesia staff the day of surgery in this regard.  Declined having a patch to apply the night before.   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  Risks associated with mesh repair were reviewed.   The federally mandated questionnaires regarding living will, power of attorney and the like were reviewed with the patient.   Clarified his preadmission testing appointment on November 08, 2020.   We will plan to proceed with surgery as scheduled on November 10, 2020.    This note is partially prepared by Dorathy Daft, RN, acting as a scribe in the presence of Dr. Donnalee Curry, MD.  The documentation recorded by the scribe accurately reflects the service I personally performed and the decisions made by me.    Earline Mayotte, MD FACS

## 2020-11-08 ENCOUNTER — Other Ambulatory Visit: Payer: Self-pay

## 2020-11-08 ENCOUNTER — Other Ambulatory Visit
Admission: RE | Admit: 2020-11-08 | Discharge: 2020-11-08 | Disposition: A | Discharge status: Home / Self Care | Payer: Medicare HMO | Source: Ambulatory Visit | Attending: General Surgery | Admitting: General Surgery

## 2020-11-08 DIAGNOSIS — Z01812 Encounter for preprocedural laboratory examination: Secondary | ICD-10-CM | POA: Insufficient documentation

## 2020-11-08 LAB — BASIC METABOLIC PANEL
Anion gap: 9 (ref 5–15)
BUN: 15 mg/dL (ref 8–23)
CO2: 27 mmol/L (ref 22–32)
Calcium: 8.9 mg/dL (ref 8.9–10.3)
Chloride: 104 mmol/L (ref 98–111)
Creatinine, Ser: 0.6 mg/dL — ABNORMAL LOW (ref 0.61–1.24)
GFR, Estimated: >60 mL/min (ref >60)
Glucose, Bld: 116 mg/dL — ABNORMAL HIGH (ref 70–99)
Potassium: 3.7 mmol/L (ref 3.5–5.1)
Sodium: 140 mmol/L (ref 135–145)

## 2020-11-08 LAB — CBC
HCT: 41.7 % (ref 39.0–52.0)
Hemoglobin: 14.2 g/dL (ref 13.0–17.0)
MCH: 32.2 pg (ref 26.0–34.0)
MCHC: 34.1 g/dL (ref 30.0–36.0)
MCV: 94.6 fL (ref 80.0–100.0)
Platelets: 161 10*3/uL (ref 150–400)
RBC: 4.41 MIL/uL (ref 4.22–5.81)
RDW: 13 % (ref 11.5–15.5)
WBC: 6.5 10*3/uL (ref 4.0–10.5)
nRBC: 0 % (ref 0.0–0.2)

## 2020-11-09 MED ORDER — FAMOTIDINE 20 MG PO TABS: 20.0000 mg | ORAL_TABLET | Freq: Once | ORAL | Status: AC

## 2020-11-09 MED ORDER — CEFAZOLIN SODIUM-DEXTROSE 2-4 GM/100ML-% IV SOLN
2.0000 g | INTRAVENOUS | Status: AC
  Administered 2020-11-10: 2 g via INTRAVENOUS

## 2020-11-09 MED ORDER — CHLORHEXIDINE GLUCONATE CLOTH 2 % EX PADS: 6.0000 | MEDICATED_PAD | Freq: Once | CUTANEOUS | Status: DC

## 2020-11-09 MED ORDER — APREPITANT 40 MG PO CAPS: 40.0000 mg | ORAL_CAPSULE | Freq: Once | ORAL | Status: AC

## 2020-11-09 MED ORDER — LACTATED RINGERS IV SOLN: INTRAVENOUS | Status: DC

## 2020-11-09 MED ORDER — ORAL CARE MOUTH RINSE: 15.0000 mL | Freq: Once | OROMUCOSAL | Status: AC

## 2020-11-09 MED ORDER — CHLORHEXIDINE GLUCONATE 0.12 % MT SOLN: 15.0000 mL | Freq: Once | OROMUCOSAL | Status: AC

## 2020-11-10 ENCOUNTER — Ambulatory Visit
Admission: RE | Admit: 2020-11-10 | Discharge: 2020-11-10 | Disposition: A | Discharge status: Home / Self Care | Payer: Medicare HMO | Attending: General Surgery | Admitting: General Surgery

## 2020-11-10 ENCOUNTER — Ambulatory Visit: Payer: Medicare HMO | Admitting: Urgent Care

## 2020-11-10 ENCOUNTER — Encounter: Payer: Self-pay | Admitting: General Surgery

## 2020-11-10 ENCOUNTER — Other Ambulatory Visit: Payer: Self-pay

## 2020-11-10 ENCOUNTER — Encounter
Admission: RE | Disposition: A | Discharge status: Home / Self Care | Payer: Self-pay | Source: Home / Self Care | Attending: General Surgery

## 2020-11-10 DIAGNOSIS — Z79899 Other long term (current) drug therapy: Secondary | ICD-10-CM | POA: Insufficient documentation

## 2020-11-10 DIAGNOSIS — Z8249 Family history of ischemic heart disease and other diseases of the circulatory system: Secondary | ICD-10-CM | POA: Insufficient documentation

## 2020-11-10 DIAGNOSIS — Z7982 Long term (current) use of aspirin: Secondary | ICD-10-CM | POA: Insufficient documentation

## 2020-11-10 DIAGNOSIS — Z791 Long term (current) use of non-steroidal anti-inflammatories (NSAID): Secondary | ICD-10-CM | POA: Diagnosis not present

## 2020-11-10 DIAGNOSIS — I480 Paroxysmal atrial fibrillation: Secondary | ICD-10-CM | POA: Insufficient documentation

## 2020-11-10 DIAGNOSIS — Z8616 Personal history of COVID-19: Secondary | ICD-10-CM | POA: Insufficient documentation

## 2020-11-10 DIAGNOSIS — K429 Umbilical hernia without obstruction or gangrene: Secondary | ICD-10-CM | POA: Insufficient documentation

## 2020-11-10 HISTORY — PX: UMBILICAL HERNIA REPAIR: SHX196

## 2020-11-10 SURGERY — REPAIR, HERNIA, UMBILICAL, ADULT
Anesthesia: General

## 2020-11-10 MED ORDER — SCOPOLAMINE 1 MG/3DAYS TD PT72
MEDICATED_PATCH | TRANSDERMAL | Status: AC
  Administered 2020-11-10: 1.5 mg via TRANSDERMAL
  Filled 2020-11-10: qty 1

## 2020-11-10 MED ORDER — CEFAZOLIN SODIUM-DEXTROSE 2-4 GM/100ML-% IV SOLN
INTRAVENOUS | Status: AC
  Filled 2020-11-10: qty 100

## 2020-11-10 MED ORDER — FENTANYL CITRATE (PF) 100 MCG/2ML IJ SOLN
INTRAMUSCULAR | Status: DC | PRN
  Administered 2020-11-10: 50 ug via INTRAVENOUS

## 2020-11-10 MED ORDER — DEXAMETHASONE SODIUM PHOSPHATE 10 MG/ML IJ SOLN
INTRAMUSCULAR | Status: AC
  Filled 2020-11-10: qty 1

## 2020-11-10 MED ORDER — PROMETHAZINE HCL 25 MG/ML IJ SOLN
6.2500 mg | INTRAMUSCULAR | Status: DC | PRN
Start: 2020-11-10 — End: 2020-11-10

## 2020-11-10 MED ORDER — MIDAZOLAM HCL 2 MG/2ML IJ SOLN
INTRAMUSCULAR | Status: DC | PRN
  Administered 2020-11-10: 2 mg via INTRAVENOUS

## 2020-11-10 MED ORDER — BUPIVACAINE HCL (PF) 0.5 % IJ SOLN
INTRAMUSCULAR | Status: AC
  Filled 2020-11-10: qty 30

## 2020-11-10 MED ORDER — PROPOFOL 1000 MG/100ML IV EMUL
INTRAVENOUS | Status: AC
  Filled 2020-11-10: qty 100

## 2020-11-10 MED ORDER — APREPITANT 40 MG PO CAPS
ORAL_CAPSULE | ORAL | Status: AC
  Administered 2020-11-10: 40 mg via ORAL
  Filled 2020-11-10: qty 1

## 2020-11-10 MED ORDER — PROPOFOL 10 MG/ML IV BOLUS
INTRAVENOUS | Status: DC | PRN
  Administered 2020-11-10: 50 mg via INTRAVENOUS
  Administered 2020-11-10: 150 mg via INTRAVENOUS

## 2020-11-10 MED ORDER — ACETAMINOPHEN 10 MG/ML IV SOLN
INTRAVENOUS | Status: AC
  Filled 2020-11-10: qty 100

## 2020-11-10 MED ORDER — SCOPOLAMINE 1 MG/3DAYS TD PT72: 1.0000 | MEDICATED_PATCH | TRANSDERMAL | Status: DC

## 2020-11-10 MED ORDER — PROPOFOL 500 MG/50ML IV EMUL
INTRAVENOUS | Status: DC | PRN
  Administered 2020-11-10: 20 ug/kg/min via INTRAVENOUS

## 2020-11-10 MED ORDER — ACETAMINOPHEN 10 MG/ML IV SOLN
INTRAVENOUS | Status: DC | PRN
  Administered 2020-11-10: 1000 mg via INTRAVENOUS

## 2020-11-10 MED ORDER — ROCURONIUM BROMIDE 10 MG/ML (PF) SYRINGE
PREFILLED_SYRINGE | INTRAVENOUS | Status: AC
  Filled 2020-11-10: qty 10

## 2020-11-10 MED ORDER — ROCURONIUM BROMIDE 100 MG/10ML IV SOLN
INTRAVENOUS | Status: DC | PRN
  Administered 2020-11-10: 40 mg via INTRAVENOUS

## 2020-11-10 MED ORDER — LIDOCAINE HCL (PF) 2 % IJ SOLN
INTRAMUSCULAR | Status: AC
  Filled 2020-11-10: qty 5

## 2020-11-10 MED ORDER — SUGAMMADEX SODIUM 200 MG/2ML IV SOLN
INTRAVENOUS | Status: DC | PRN
  Administered 2020-11-10: 200 mg via INTRAVENOUS

## 2020-11-10 MED ORDER — HYDROCODONE-ACETAMINOPHEN 5-325 MG PO TABS: 1.0000 | ORAL_TABLET | ORAL | 0 refills | Status: AC | PRN

## 2020-11-10 MED ORDER — DEXAMETHASONE SODIUM PHOSPHATE 10 MG/ML IJ SOLN
INTRAMUSCULAR | Status: DC | PRN
  Administered 2020-11-10: 10 mg via INTRAVENOUS

## 2020-11-10 MED ORDER — CHLORHEXIDINE GLUCONATE 0.12 % MT SOLN
OROMUCOSAL | Status: AC
  Administered 2020-11-10: 15 mL via OROMUCOSAL
  Filled 2020-11-10: qty 15

## 2020-11-10 MED ORDER — ONDANSETRON HCL 4 MG/2ML IJ SOLN
INTRAMUSCULAR | Status: DC | PRN
  Administered 2020-11-10: 4 mg via INTRAVENOUS

## 2020-11-10 MED ORDER — KETOROLAC TROMETHAMINE 30 MG/ML IJ SOLN
INTRAMUSCULAR | Status: AC
  Filled 2020-11-10: qty 1

## 2020-11-10 MED ORDER — ONDANSETRON HCL 4 MG/2ML IJ SOLN
INTRAMUSCULAR | Status: AC
  Filled 2020-11-10: qty 2

## 2020-11-10 MED ORDER — OXYCODONE HCL 5 MG PO TABS: 5.0000 mg | ORAL_TABLET | Freq: Once | ORAL | Status: DC | PRN

## 2020-11-10 MED ORDER — KETOROLAC TROMETHAMINE 30 MG/ML IJ SOLN
INTRAMUSCULAR | Status: DC | PRN
  Administered 2020-11-10: 30 mg via INTRAVENOUS

## 2020-11-10 MED ORDER — FENTANYL CITRATE (PF) 100 MCG/2ML IJ SOLN
INTRAMUSCULAR | Status: AC
  Filled 2020-11-10: qty 2

## 2020-11-10 MED ORDER — BUPIVACAINE HCL 0.5 % IJ SOLN
INTRAMUSCULAR | Status: DC | PRN
  Administered 2020-11-10: 30 mL

## 2020-11-10 MED ORDER — LIDOCAINE HCL (CARDIAC) PF 100 MG/5ML IV SOSY
PREFILLED_SYRINGE | INTRAVENOUS | Status: DC | PRN
  Administered 2020-11-10: 100 mg via INTRAVENOUS

## 2020-11-10 MED ORDER — FENTANYL CITRATE (PF) 100 MCG/2ML IJ SOLN: 25.0000 ug | INTRAMUSCULAR | Status: DC | PRN

## 2020-11-10 MED ORDER — FAMOTIDINE 20 MG PO TABS
ORAL_TABLET | ORAL | Status: AC
  Administered 2020-11-10: 20 mg via ORAL
  Filled 2020-11-10: qty 1

## 2020-11-10 MED ORDER — OXYCODONE HCL 5 MG/5ML PO SOLN
5.0000 mg | Freq: Once | ORAL | Status: DC | PRN
Start: 2020-11-10 — End: 2020-11-10

## 2020-11-10 MED ORDER — MIDAZOLAM HCL 2 MG/2ML IJ SOLN
INTRAMUSCULAR | Status: AC
  Filled 2020-11-10: qty 2

## 2020-11-10 SURGICAL SUPPLY — 33 items
APL PRP STRL LF DISP 70% ISPRP (MISCELLANEOUS) ×1
BLADE SURG 15 STRL SS SAFETY (BLADE) ×2 IMPLANT
CANISTER SUCT 1200ML W/VALVE (MISCELLANEOUS) ×2 IMPLANT
CHLORAPREP W/TINT 26 (MISCELLANEOUS) ×2 IMPLANT
DRAPE LAPAROTOMY 100X77 ABD (DRAPES) ×2 IMPLANT
DRSG TEGADERM 4X4.75 (GAUZE/BANDAGES/DRESSINGS) ×2 IMPLANT
DRSG TELFA 4X3 1S NADH ST (GAUZE/BANDAGES/DRESSINGS) ×2 IMPLANT
ELECT REM PT RETURN 9FT ADLT (ELECTROSURGICAL) ×2
ELECTRODE REM PT RTRN 9FT ADLT (ELECTROSURGICAL) ×1 IMPLANT
GAUZE 4X4 16PLY ~~LOC~~+RFID DBL (SPONGE) ×2 IMPLANT
GLOVE SURG ENC MOIS LTX SZ7.5 (GLOVE) ×2 IMPLANT
GLOVE SURG UNDER LTX SZ8 (GLOVE) ×2 IMPLANT
GOWN STRL REUS W/ TWL LRG LVL3 (GOWN DISPOSABLE) ×2 IMPLANT
GOWN STRL REUS W/TWL LRG LVL3 (GOWN DISPOSABLE) ×4
KIT TURNOVER KIT A (KITS) ×2 IMPLANT
LABEL OR SOLS (LABEL) ×2 IMPLANT
MANIFOLD NEPTUNE II (INSTRUMENTS) ×2 IMPLANT
MESH VENTRALEX ST 2.5 CRC MED (Mesh General) ×1 IMPLANT
NDL HYPO 25X1 1.5 SAFETY (NEEDLE) ×1 IMPLANT
NEEDLE HYPO 22GX1.5 SAFETY (NEEDLE) ×4 IMPLANT
NEEDLE HYPO 25X1 1.5 SAFETY (NEEDLE) ×2 IMPLANT
NS IRRIG 500ML POUR BTL (IV SOLUTION) ×2 IMPLANT
PACK BASIN MINOR ARMC (MISCELLANEOUS) ×2 IMPLANT
STRIP CLOSURE SKIN 1/2X4 (GAUZE/BANDAGES/DRESSINGS) ×2 IMPLANT
SUT SURGILON 0 BLK (SUTURE) ×3 IMPLANT
SUT VIC AB 3-0 54X BRD REEL (SUTURE) ×2 IMPLANT
SUT VIC AB 3-0 BRD 54 (SUTURE) ×2
SUT VIC AB 3-0 SH 27 (SUTURE) ×2
SUT VIC AB 3-0 SH 27X BRD (SUTURE) ×1 IMPLANT
SUT VIC AB 4-0 FS2 27 (SUTURE) ×2 IMPLANT
SWABSTK COMLB BENZOIN TINCTURE (MISCELLANEOUS) ×2 IMPLANT
SYR 10ML LL (SYRINGE) ×2 IMPLANT
SYR 3ML LL SCALE MARK (SYRINGE) ×2 IMPLANT

## 2020-11-10 NOTE — Anesthesia Procedure Notes (Signed)
Procedure Name: Intubation Date/Time: 11/10/2020 1:59 PM Performed by: Ginger Carne, CRNA Pre-anesthesia Checklist: Patient identified, Emergency Drugs available, Suction available, Patient being monitored and Timeout performed Patient Re-evaluated:Patient Re-evaluated prior to induction Oxygen Delivery Method: Circle system utilized Preoxygenation: Pre-oxygenation with 100% oxygen Induction Type: IV induction Ventilation: Mask ventilation without difficulty and Oral airway inserted - appropriate to patient size Laryngoscope Size: McGraph and 4 Grade View: Grade II Tube type: Oral Tube size: 7.5 mm Number of attempts: 1 Airway Equipment and Method: Stylet and Video-laryngoscopy Placement Confirmation: ETT inserted through vocal cords under direct vision, positive ETCO2 and breath sounds checked- equal and bilateral Secured at: 22 cm Tube secured with: Tape Dental Injury: Teeth and Oropharynx as per pre-operative assessment  Difficulty Due To: Difficulty was unanticipated

## 2020-11-10 NOTE — Discharge Instructions (Signed)

## 2020-11-10 NOTE — Progress Notes (Signed)
Dr. Lemar Livings spoke to patient and patient's sister while in phase II. Questions asked and answered. Dr. Lemar Livings aware of drainage on umbilical dressing, ok with and instructed patient not to remove for 72hrs. Patient voided prior to discharge.  Patient verbalizes understanding of all instructions and plan of care.

## 2020-11-10 NOTE — Progress Notes (Signed)
Pt's sister does not receive text messages, Fulton Mole (sister)notified that pt will be going into the OR at this time.

## 2020-11-10 NOTE — Anesthesia Preprocedure Evaluation (Addendum)
Anesthesia Evaluation  Patient identified by MRN, date of birth, ID band Patient awake    Reviewed: Allergy & Precautions, NPO status , Patient's Chart, lab work & pertinent test results, reviewed documented beta blocker date and time   History of Anesthesia Complications (+) PONV  Airway Mallampati: III  TM Distance: >3 FB Neck ROM: full    Dental  (+) Poor Dentition,    Pulmonary sleep apnea ,    Pulmonary exam normal        Cardiovascular Exercise Tolerance: Good Normal cardiovascular exam+ dysrhythmias (Paroxysmal Afib, Sinus Brady on last EKG)   Grade II Diastolic Dysfunction   Neuro/Psych negative neurological ROS  negative psych ROS   GI/Hepatic negative GI ROS, Neg liver ROS,   Endo/Other  negative endocrine ROS  Renal/GU Renal disease (Frequent kidney stones)  negative genitourinary   Musculoskeletal  (+) Arthritis ,   Abdominal (+) + obese,   Peds negative pediatric ROS (+)  Hematology negative hematology ROS (+)   Anesthesia Other Findings Past Medical History: No date: Arthritis No date: Complication of anesthesia     Comment:  PONV 2022: COVID-19 No date: History of kidney stones No date: Hyperlipidemia No date: Kidney stones No date: Paroxysmal A-fib (HCC) No date: Sleep apnea  Past Surgical History: No date: COLONOSCOPY 08/19/2020: EXTRACORPOREAL SHOCK WAVE LITHOTRIPSY; Left     Comment:  Procedure: EXTRACORPOREAL SHOCK WAVE LITHOTRIPSY (ESWL);              Surgeon: Orson Ape, MD;  Location: [facility] ORS;                Service: Urology;  Laterality: Left; No date: LITHOTRIPSY     Comment:  x 8     Reproductive/Obstetrics negative OB ROS                            Anesthesia Physical Anesthesia Plan  ASA: 3  Anesthesia Plan: General ETT   Post-op Pain Management:    Induction: Intravenous  PONV Risk Score and Plan: 3 and Ondansetron,  Dexamethasone, Midazolam, Treatment may vary due to age or medical condition, Aprepitant and Propofol infusion  Airway Management Planned: Oral ETT  Additional Equipment:   Intra-op Plan:   Post-operative Plan: Extubation in OR  Informed Consent: I have reviewed the patients History and Physical, chart, labs and discussed the procedure including the risks, benefits and alternatives for the proposed anesthesia with the patient or authorized representative who has indicated his/her understanding and acceptance.     Dental Advisory Given  Plan Discussed with: Anesthesiologist, CRNA and Surgeon  Anesthesia Plan Comments: (Patient consented for risks of anesthesia including but not limited to:  - adverse reactions to medications - damage to eyes, teeth, lips or other oral mucosa - nerve damage due to positioning  - sore throat or hoarseness - Damage to heart, brain, nerves, lungs, other parts of body or loss of life  Patient voiced understanding.)        Anesthesia Quick Evaluation

## 2020-11-10 NOTE — H&P (Signed)
Marcus Bryant [id number] 1955-09-11     HPI: 65 y/o male with a symptomatic umbilical hernia. For repair. Possible prosthetic mesh replacement.   Facility-Administered Medications Prior to Admission  Medication Dose Route Frequency Provider Last Rate Last Admin   betamethasone acetate-betamethasone sodium phosphate (CELESTONE) injection 3 mg  3 mg Intramuscular Once Gala Lewandowsky M, DPM       betamethasone acetate-betamethasone sodium phosphate (CELESTONE) injection 3 mg  3 mg Intramuscular Once Felecia Shelling, DPM       Medications Prior to Admission  Medication Sig Dispense Refill Last Dose   aspirin 81 MG tablet Take 81 mg by mouth at bedtime.      Ca Carbonate-Mag Hydroxide (ROLAIDS PO) Take 1-2 tablets by mouth daily as needed (heartburn).      flecainide (TAMBOCOR) 100 MG tablet Take 1 tablet (100 mg total) by mouth 2 (two) times daily as needed (As needed for atrial fibrillation.). 30 tablet 1    ibuprofen (ADVIL) 200 MG tablet Take 400-800 mg by mouth every 6 (six) hours as needed for headache or moderate pain.   10/27/2020   Krill Oil 350 MG CAPS Take 350 mg by mouth daily.   Past Week   metoprolol succinate (TOPROL-XL) 25 MG 24 hr tablet TAKE 1 TABLET BY MOUTH EVERY DAY (Patient taking differently: Take 25 mg by mouth at bedtime.) 90 tablet 2 11/09/2020 at 2200   metoprolol tartrate (LOPRESSOR) 25 MG tablet Take 1 tablet (25 mg total) by mouth 2 (two) times daily as needed. (Patient taking differently: Take 25 mg by mouth 2 (two) times daily as needed (afib).) 60 tablet 3 11/09/2020 at 2200   oxyCODONE (OXY IR/ROXICODONE) 5 MG immediate release tablet Take 5 mg by mouth daily as needed (kidney stone pain).   Past Month   simvastatin (ZOCOR) 80 MG tablet Take 40 mg by mouth daily.   11/09/2020   SUPER B COMPLEX/C PO Take 1 tablet by mouth at bedtime.   Past Week   tamsulosin (FLOMAX) 0.4 MG CAPS capsule Take 1 capsule (0.4 mg total) by mouth daily. (Patient taking differently: Take 0.4  mg by mouth at bedtime.) 30 capsule 11 Past Month   acetaminophen-codeine (TYLENOL #3) 300-30 MG tablet Take 1 tablet by mouth every 4 (four) hours as needed for moderate pain. (Patient not taking: No sig reported) 20 tablet 1 Completed Course   ciprofloxacin (CIPRO) 500 MG tablet Take 1 tablet (500 mg total) by mouth 2 (two) times daily. (Patient not taking: No sig reported) 6 tablet 0 Completed Course   docusate sodium (COLACE) 100 MG capsule Take 2 capsules (200 mg total) by mouth 2 (two) times daily. (Patient not taking: No sig reported) 120 capsule 3 Completed Course   ondansetron (ZOFRAN ODT) 8 MG disintegrating tablet Take 1 tablet (8 mg total) by mouth every 6 (six) hours as needed for nausea or vomiting. (Patient not taking: No sig reported) 10 tablet 3 Completed Course   No Known Allergies Past Medical History:  Diagnosis Date   Arthritis    Complication of anesthesia    PONV   COVID-19 2022   History of kidney stones    Hyperlipidemia    Kidney stones    Paroxysmal A-fib [facility])    Sleep apnea    Past Surgical History:  Procedure Laterality Date   COLONOSCOPY     EXTRACORPOREAL SHOCK WAVE LITHOTRIPSY Left 08/19/2020   Procedure: EXTRACORPOREAL SHOCK WAVE LITHOTRIPSY (ESWL);  Surgeon: Orson Ape, MD;  Location:  [facility] ORS;  Service: Urology;  Laterality: Left;   LITHOTRIPSY     x 8   Social History   Socioeconomic History   Marital status: Divorced    Spouse name: Not on file   Number of children: Not on file   Years of education: Not on file   Highest education level: Not on file  Occupational History   Not on file  Tobacco Use   Smoking status: Never   Smokeless tobacco: Never  Vaping Use   Vaping Use: Never used  Substance and Sexual Activity   Alcohol use: No   Drug use: No   Sexual activity: Not on file  Other Topics Concern   Not on file  Social History Narrative   Lives alone   Social Determinants of Health   Financial Resource Strain: Not on  file  Food Insecurity: Not on file  Transportation Needs: Not on file  Physical Activity: Not on file  Stress: Not on file  Social Connections: Not on file  Intimate Partner Violence: Not on file   Social History   Social History Narrative   Lives alone     ROS: Negative.     PE: HEENT: Negative. Lungs: Clear. Cardio: RR.   Assessment/Plan:  Proceed with planned umbilical hernia repair.    Merrily [facility] 11/10/2020

## 2020-11-10 NOTE — Op Note (Signed)
Preoperative diagnosis: Symptomatic umbilical hernia.  Postoperative diagnosis: Same.  Operative procedure: Umbilical hernia repair with 6.4 cm Ventralex mesh.  Operating surgeon: Donnalee Curry, MD.  Anesthesia: General endotracheal, Marcaine 0.5%, plain, 30 cc.  Estimated blood loss: Less than 5 cc.  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  He was felt to be a candidate for elective hernia repair with prosthetic mesh.  The patient received Ancef prior to the procedure.  SCD stockings for DVT prevention.  Hered been removed from the surgical site prior to presentation to the operating theater.  Operative note: Patient underwent general anesthesia and tolerated this well.  The abdomen was cleansed with ChloraPrep and draped.  Field block anesthesia was placed.  An infraumbilical incision was made in the skin incised sharply.  Remaining dissection with electrocautery.  The hernia sac was dissected free from the undersurface of the umbilical skin transected at the fascial level excised and discarded.  The defect measured about 1.8 cm in diameter.  It was elected to make use of mesh reinforcement.  The undersurface of the fascia was cleared circumferentially with hemostasis with electrocautery.  A 6.4 cm Ventralex mesh was placed.  The fascial defect was closed transversely with interrupted 0 Surgilon sutures taking a "bite" of the mesh with each suture passage.  The umbilical skin was tacked to the fascia with a 3-0 Vicryl figure-of-eight suture.  The adipose layer was closed with a running 3-0 Vicryl suture.  The skin was closed with a running 4-0 Vicryl subcuticular suture.  Benzoin, Steri-Strips, Telfa and Tegaderm dressing applied.  The patient had a turbulent extubation with vigorous coughing.  Pressure was held against the hernia site during this time.  Patient was transported to the PACU in stable  condition.

## 2020-11-10 NOTE — Transfer of Care (Signed)
Immediate Anesthesia Transfer of Care Note  Patient: Marcus Bryant  Procedure(s) Performed: HERNIA REPAIR UMBILICAL ADULT  Patient Location: PACU  Anesthesia Type:General  Level of Consciousness: sedated  Airway & Oxygen Therapy: Patient Spontanous Breathing and Patient connected to face mask oxygen  Post-op Assessment: Report given to RN and Post -op Vital signs reviewed and stable  Post vital signs: Reviewed and stable  Last Vitals:  Vitals Value Taken Time  BP 116/69 11/10/20 1445  Temp 36.3 C 11/10/20 1445  Pulse 50 11/10/20 1448  Resp 16 11/10/20 1448  SpO2 100 % 11/10/20 1448  Vitals shown include unvalidated device data.  Last Pain:  Vitals:   11/10/20 1311  TempSrc: Tympanic  PainSc: 0-No pain      Patients Stated Pain Goal: 0 (11/10/20 1311)  Complications: No notable events documented.

## 2020-11-11 ENCOUNTER — Other Ambulatory Visit: Payer: Self-pay | Admitting: General Surgery

## 2020-11-11 ENCOUNTER — Encounter: Payer: Self-pay | Admitting: General Surgery

## 2020-11-11 NOTE — Anesthesia Postprocedure Evaluation (Signed)
Anesthesia Post Note  Patient: Marcus Bryant  Procedure(s) Performed: HERNIA REPAIR UMBILICAL ADULT  Patient location during evaluation: PACU Anesthesia Type: General Level of consciousness: awake and alert Pain management: pain level controlled Vital Signs Assessment: post-procedure vital signs reviewed and stable Respiratory status: spontaneous breathing, nonlabored ventilation and respiratory function stable Cardiovascular status: blood pressure returned to baseline and stable Postop Assessment: no apparent nausea or vomiting Anesthetic complications: no   No notable events documented.   Last Vitals:  Vitals:   11/10/20 1619 11/10/20 1622  BP: 122/61   Pulse: (!) 51   Resp: 18   Temp: (!) 36.2 C   SpO2: 94% 95%    Last Pain:  Vitals:   11/11/20 0823  TempSrc:   PainSc: 0-No pain                 Foye Deer

## 2021-03-08 ENCOUNTER — Other Ambulatory Visit: Payer: Self-pay

## 2021-03-08 ENCOUNTER — Ambulatory Visit: Payer: Medicare HMO | Admitting: Nurse Practitioner

## 2021-03-08 ENCOUNTER — Encounter: Payer: Self-pay | Admitting: Nurse Practitioner

## 2021-03-08 ENCOUNTER — Telehealth: Payer: Self-pay | Admitting: Cardiovascular Disease

## 2021-03-08 VITALS — BP 110/70 | HR 75 | Ht 72.0 in | Wt 250.0 lb

## 2021-03-08 DIAGNOSIS — G4733 Obstructive sleep apnea (adult) (pediatric): Secondary | ICD-10-CM

## 2021-03-08 DIAGNOSIS — I480 Paroxysmal atrial fibrillation: Secondary | ICD-10-CM | POA: Diagnosis not present

## 2021-03-08 DIAGNOSIS — R072 Precordial pain: Secondary | ICD-10-CM | POA: Diagnosis not present

## 2021-03-08 DIAGNOSIS — I10 Essential (primary) hypertension: Secondary | ICD-10-CM

## 2021-03-08 DIAGNOSIS — E782 Mixed hyperlipidemia: Secondary | ICD-10-CM

## 2021-03-08 MED ORDER — METOPROLOL SUCCINATE ER 50 MG PO TB24: 50.0000 mg | ORAL_TABLET | Freq: Every day | ORAL | 1 refills | Status: DC

## 2021-03-08 NOTE — Patient Instructions (Addendum)
Medication Instructions:  - Your physician has recommended you make the following change in your medication:   1) INCREASE metoprolol succinate to 50 mg: - take 1 tablet by mouth once daily   *If you need a refill on your cardiac medications before your next appointment, please call your pharmacy*   Lab Work: - none ordered  If you have labs (blood work) drawn today and your tests are completely normal, you will receive your results only by: MyChart Message (if you have MyChart) OR A paper copy in the mail If you have any lab test that is abnormal or we need to change your treatment, we will call you to review the results.   Testing/Procedures: - Your physician has requested that you have an exercise stress myoview.   [facility] MYOVIEW  Your caregiver has ordered a Stress Test with nuclear imaging. The purpose of this test is to evaluate the blood supply to your heart muscle. This procedure is referred to as a "Non-Invasive Stress Test." This is because other than having an IV started in your vein, nothing is inserted or "invades" your body. Cardiac stress tests are done to find areas of poor blood flow to the heart by determining the extent of coronary artery disease (CAD). Some patients exercise on a treadmill, which naturally increases the blood flow to your heart, while others who are  unable to walk on a treadmill due to physical limitations have a pharmacologic/chemical stress agent called Lexiscan . This medicine will mimic walking on a treadmill by temporarily increasing your coronary blood flow.   Please note: these test may take anywhere between 2-4 hours to complete  PLEASE REPORT TO [facility] MEDICAL MALL ENTRANCE  GO TO THE 1st DESK ON THE RIGHT TO CHECK IN (REGISTRATION)  Date of Procedure:_____________________________________  Arrival Time for Procedure:______________________________  Instructions regarding medication:   _x___:  Hold betablocker(s) night before procedure and  morning of procedure- METOPROLOL  _x___:  You may take all of your other regular morning medications the day of your test with enough water to get them down safely  PLEASE NOTIFY THE OFFICE AT LEAST 24 HOURS IN ADVANCE IF YOU ARE UNABLE TO KEEP YOUR APPOINTMENT.  [phone] AND  PLEASE NOTIFY NUCLEAR MEDICINE AT [facility] AT LEAST 24 HOURS IN ADVANCE IF YOU ARE UNABLE TO KEEP YOUR APPOINTMENT. [phone]  How to prepare for your Myoview test:  Do not eat or drink after midnight No caffeine for 24 hours prior to test No smoking 24 hours prior to test. Your medication may be taken with water.  If your doctor stopped a medication because of this test, do not take that medication. Ladies, please do not wear dresses.  Skirts or pants are appropriate. Please wear a short sleeve shirt. No perfume, cologne or lotion. Wear comfortable walking shoes. No heels!    Follow-Up: At [facility], you and your health needs are our priority.  As part of our continuing mission to provide you with exceptional heart care, we have created designated Provider Care Teams.  These Care Teams include your primary Cardiologist (physician) and Advanced Practice Providers (APPs -  Physician Assistants and Nurse Practitioners) who all work together to provide you with the care you need, when you need it.  We recommend signing up for the patient portal called "MyChart".  Sign up information is provided on this After Visit Summary.  MyChart is used to connect with patients for Virtual Visits (Telemedicine).  Patients are able to view lab/test results,  encounter notes, upcoming appointments, etc.  Non-urgent messages can be sent to your provider as well.   To learn more about what you can do with MyChart, go to [email or web address].    Your next appointment:   1 month(s)  The format for your next appointment:   In Person  Provider:   You may see Julien Nordmann, MD or one of the following Advanced Practice  Providers on your designated Care Team:   Nicolasa Ducking, NP  Other Instructions  Cardiac Nuclear Scan A cardiac nuclear scan is a test that is done to check the flow of blood to your heart. It is done when you are resting and when you are exercising. The test looks for problems such as: Not enough blood reaching a portion of the heart. The heart muscle not working as it should. You may need this test if: You have heart disease. You have had lab results that are not normal. You have had heart surgery or a balloon procedure to open up blocked arteries (angioplasty). You have chest pain. You have shortness of breath. In this test, a special dye (tracer) is put into your bloodstream. The tracer will travel to your heart. A camera will then take pictures of your heart to see how the tracer moves through your heart. This test is usually done at a hospital and takes 2-4 hours. Tell a doctor about: Any allergies you have. All medicines you are taking, including vitamins, herbs, eye drops, creams, and over-the-counter medicines. Any problems you or family members have had with anesthetic medicines. Any blood disorders you have. Any surgeries you have had. Any medical conditions you have. Whether you are pregnant or may be pregnant. What are the risks? Generally, this is a safe test. However, problems may occur, such as: Serious chest pain and heart attack. This is only a risk if the stress portion of the test is done. Rapid heartbeat. A feeling of warmth in your chest. This feeling usually does not last long. Allergic reaction to the tracer. What happens before the test? Ask your doctor about changing or stopping your normal medicines. This is important. Follow instructions from your doctor about what you cannot eat or drink. Remove your jewelry on the day of the test. What happens during the test? An IV tube will be inserted into one of your veins. Your doctor will give you a small  amount of tracer through the IV tube. You will wait for 20-40 minutes while the tracer moves through your bloodstream. Your heart will be monitored with an electrocardiogram (ECG). You will lie down on an exam table. Pictures of your heart will be taken for about 15-20 minutes. You may also have a stress test. For this test, one of these things may be done: You will be asked to exercise on a treadmill or a stationary bike. You will be given medicines that will make your heart work harder. This is done if you are unable to exercise. When blood flow to your heart has peaked, a tracer will again be given through the IV tube. After 20-40 minutes, you will get back on the exam table. More pictures will be taken of your heart. Depending on the tracer that is used, more pictures may need to be taken 3-4 hours later. Your IV tube will be removed when the test is over. The test may vary among doctors and hospitals. What happens after the test? Ask your doctor: Whether you can return to your normal schedule, including  diet, activities, and medicines. Whether you should drink more fluids. This will help to remove the tracer from your body. Drink enough fluid to keep your pee (urine) pale yellow. Ask your doctor, or the department that is doing the test: When will my results be ready? How will I get my results? Summary A cardiac nuclear scan is a test that is done to check the flow of blood to your heart. Tell your doctor whether you are pregnant or may be pregnant. Before the test, ask your doctor about changing or stopping your normal medicines. This is important. Ask your doctor whether you can return to your normal activities. You may be asked to drink more fluids. This information is not intended to replace advice given to you by your health care provider. Make sure you discuss any questions you have with your health care provider. Document Revised: 12/22/2020 Document Reviewed:  09/22/2020 Elsevier Patient Education  2022 [employer].

## 2021-03-08 NOTE — Telephone Encounter (Signed)
Patient scheduled today

## 2021-03-08 NOTE — Telephone Encounter (Signed)
Patient c/o Palpitations:  High priority if patient c/o lightheadedness, shortness of breath, or chest pain  How long have you had palpitations/irregular HR/ Afib? Are you having the symptoms now? 10 minutes   Are you currently experiencing lightheadedness, SOB or CP? Unknown   Do you have a history of afib (atrial fibrillation) or irregular heart rhythm? Yes   Have you checked your BP or HR? (document readings if available): 140/94  125   Are you experiencing any other symptoms? Patient at urology for procedure unable to proceed possibility of afib asap appt requested per Dr. Nicola Girt same day appt this afternoon RN declined as he needs to be seen immediately and MD wants to know if gollan advises ED vs work in now.

## 2021-03-08 NOTE — Telephone Encounter (Signed)
Incoming triage call from urology RN, nurse reports pt currently in A-fib with rate 120s for about 10 mins.  140/94  HR 125   Per nurse, Dr. Artis Flock requesting pt to be seen at this time by [name], however no open spots this am and cannot make an "open" spot as all time slots are filled. Advised best option is for Marcus Bryant to seek the ED for evaluation. Nurse stated "thanks" then hung up.   Will have scheduling reach out to schedule an appt to be seen, last visit with [name] was 04/06/2020, it is time for yearly f/u.

## 2021-03-08 NOTE — Progress Notes (Signed)
Office Visit    Patient Name: Marcus Bryant Date of Encounter: 03/08/2021  Primary Care Provider:  Marina Goodell, MD Primary Cardiologist:  None  Chief Complaint    65 year old male with a history of paroxysmal atrial fibrillation, obstructive sleep apnea, hyperlipidemia, obesity, and nephrolithiasis, who presents for follow-up of paroxysmal atrial fibrillation.  Past Medical History    Past Medical History:  Diagnosis Date   Arthritis    Chest pain    a. 08/2013 in the setting of atrial fibrillation-->MV EF 70%, no ischemia or infarct.   Complication of anesthesia    PONV   COVID-19 2022   Diastolic dysfunction    a. 10/2019 Echo: EF 60-65%, mild LVH, grade II diastolic dysfunction.  Normal RV function.  Moderately dilated LA.   History of kidney stones    Hyperlipidemia    Kidney stones    Morbid obesity (HCC)    Paroxysmal A-fib (HCC)    a. Dx 08/2013 - not previously on OAC; b. CHA2DS2VASc = 1; c. 09/2019 Flecainide 100mg  prn added.   Sleep apnea    Past Surgical History:  Procedure Laterality Date   COLONOSCOPY     EXTRACORPOREAL SHOCK WAVE LITHOTRIPSY Left 08/19/2020   Procedure: EXTRACORPOREAL SHOCK WAVE LITHOTRIPSY (ESWL);  Surgeon: 08/21/2020, MD;  Location: [facility] ORS;  Service: Urology;  Laterality: Left;   LITHOTRIPSY     x 8   UMBILICAL HERNIA REPAIR N/A 11/10/2020   Procedure: HERNIA REPAIR UMBILICAL ADULT;  Surgeon: 11/12/2020, MD;  Location: [facility] ORS;  Service: General;  Laterality: N/A;  open umbilical hernia repair    Allergies  No Known Allergies  History of Present Illness    65 year old male with the above past medical history including paroxysmal atrial fibrillation, obstructive sleep apnea, hyperlipidemia, obesity, and nephrolithiasis.  He apparently had an abnormal stress test in 2008.  In May 2015, he presented to the hospital with chest pain and was found to be in atrial fibrillation that subsequently converted to sinus  rhythm.  Stress testing was undertaken and was low risk with normal LV function.  CHA2DS2VASc was 0 at the time and he was anticoagulated.  January 2021, he was seen in the emergency room for A. fib, where he converted spontaneously.  In June 2021, flecainide 100 mg as needed was added to his regimen.  An echocardiogram in July 2021 showed an EF of 60 to 65% with mild LVH and grade 2 diastolic dysfunction.   At follow-up in December 2021, it was noted that he was having episodic A. fib predominantly occurring at 4 AM.  Patient says that he has had A. fib maybe 4 times over the past 8 years.  He made an urgent appointment today because he was scheduled for an IVP through urology earlier in the day and upon laying down for the test, he had sudden onset of tachypalpitations and dyspnea.  Things seem to improve as soon as he sat and then stood up.  He did note mild chest burning.  His procedure was canceled and he was advised to present to the emergency department.  Instead, he drove home and took a flecainide and short acting metoprolol (he takes Toprol-XL 25 mg nightly and did take his dose last night).  He is in sinus rhythm currently and is asymptomatic.  ECG is normal.  Of note, due to a dye allergy, he did take prednisone yesterday.  Home Medications    Current Outpatient Medications  Medication Sig Dispense Refill   aspirin 81 MG tablet Take 81 mg by mouth at bedtime.     Ca Carbonate-Mag Hydroxide (ROLAIDS PO) Take 1-2 tablets by mouth daily as needed (heartburn).     flecainide (TAMBOCOR) 100 MG tablet Take 100 mg by mouth 2 (two) times daily as needed.     HYDROcodone-acetaminophen (NORCO/VICODIN) 5-325 MG tablet Take 1 tablet by mouth every 4 (four) hours as needed for moderate pain. 20 tablet 0   ibuprofen (ADVIL) 200 MG tablet Take 400-800 mg by mouth every 6 (six) hours as needed for headache or moderate pain.     Krill Oil 350 MG CAPS Take 350 mg by mouth daily.     metoprolol succinate  (TOPROL-XL) 50 MG 24 hr tablet Take 1 tablet (50 mg total) by mouth daily. Take with or immediately following a meal. 90 tablet 1   metoprolol tartrate (LOPRESSOR) 25 MG tablet Take 1 tablet (25 mg total) by mouth 2 (two) times daily as needed. 60 tablet 3   ondansetron (ZOFRAN ODT) 8 MG disintegrating tablet Take 1 tablet (8 mg total) by mouth every 6 (six) hours as needed for nausea or vomiting. 10 tablet 3   simvastatin (ZOCOR) 80 MG tablet Take 40 mg by mouth daily.     SUPER B COMPLEX/C PO Take 1 tablet by mouth at bedtime.     tamsulosin (FLOMAX) 0.4 MG CAPS capsule Take 0.4 mg by mouth every other day.     No current facility-administered medications for this visit.     Review of Systems    Tachypalpitations and dyspnea occurring this morning prior to IVP.  This was also associated with chest burning.  He denies PND, orthopnea, dizziness, syncope, edema, or early satiety all other systems reviewed and are otherwise negative except as noted above.    Physical Exam    VS:  BP 110/70 (BP Location: Left Arm, Patient Position: Sitting, Cuff Size: Large)   Pulse 75   Ht 6' (1.829 m)   Wt 250 lb (113.4 kg)   SpO2 97%   BMI 33.91 kg/m  , BMI Body mass index is 33.91 kg/m.     GEN: Well nourished, well developed, in no acute distress. HEENT: normal. Neck: Supple, no JVD, carotid bruits, or masses. Cardiac: RRR, no murmurs, rubs, or gallops. No clubbing, cyanosis, edema.  Radials/PT 2+ and equal bilaterally.  Respiratory:  Respirations regular and unlabored, clear to auscultation bilaterally. GI: Soft, nontender, nondistended, BS + x 4. MS: no deformity or atrophy. Skin: warm and dry, no rash. Neuro:  Strength and sensation are intact. Psych: Normal affect.  Accessory Clinical Findings    ECG personally reviewed by me today -regular sinus rhythm, 75- no acute changes.  Lab Results  Component Value Date   WBC 6.5 11/08/2020   HGB 14.2 11/08/2020   HCT 41.7 11/08/2020   MCV  94.6 11/08/2020   PLT 161 11/08/2020   Lab Results  Component Value Date   CREATININE 0.60 (L) 11/08/2020   BUN 15 11/08/2020   NA 140 11/08/2020   K 3.7 11/08/2020   CL 104 11/08/2020   CO2 27 11/08/2020   Lab Results  Component Value Date   ALT 27 09/05/2013   AST 19 09/05/2013   ALKPHOS 74 09/05/2013   BILITOT 0.7 09/05/2013   Lab Results  Component Value Date   CHOL 175 09/06/2013   HDL 29 (L) 09/06/2013   LDLCALC 117 (H) 09/06/2013   TRIG 145 09/06/2013  Lab Results  Component Value Date   HGBA1C 5.3 09/06/2013    Assessment & Plan    1.  Paroxysmal atrial fibrillation: Patient with prior history of paroxysmal atrial fibrillation, stating that he has had 4 episodes over the past 8 years.  He takes metoprolol succinate 25 mg daily in the evening.  He also has metoprolol tartrate and flecainide 100 mg to be used as needed.  CHA2DS2VASc previously 0 but now 1 in the setting of age of 28.  He is only on aspirin.  This morning, while getting ready to have an IVP through urology.  He laid down and had sudden onset of tachypalpitations and dyspnea associated with chest burning.  Palpitations and dyspnea were fairly short-lived however, mild burning persisted.  He went home and took a metoprolol and flecainide.  He is currently asymptomatic and ECG shows sinus rhythm.  Its not clear if he had A. fib this morning or not but given chest burning and ongoing as needed flecainide usage, I will arrange for a Lexiscan Myoview.  I increased his Toprol-XL to 50 mg daily.  We discussed his prior diagnosis of sleep apnea and CPAP noncompliance.  He has not used in many years due to intolerance of the mask, especially when sleeping on his abdomen.  He is not interested in pursuing a repeat study to assess for progression and consideration of alternate mask strategies.  2.  Precordial chest burning: This occurred in the setting of tachypalpitations and dyspnea.  This is since resolved.  ECG is  normal.  He says symptoms are somewhat similar to his reflux.  As he takes flecainide as needed, I will arrange for a Lexiscan Myoview.  Continue aspirin and statin.  3.  Essential hypertension: Stable.  4.  Hyperlipidemia: On simvastatin with lipids followed by primary care.  5.  Morbid obesity: Patient notes that he has been active over the past year and has gained roughly 30 pounds.  Encourage regular activity and caloric restriction.  6.  Obstructive sleep apnea: Did not tolerate CPAP because he sleeps on his abdomen.  Not interested in restudying/resuming therapy.  7.  Nephrolithiasis: Patient with a long history of nephrolithiasis with plan for IVP today.  He notes that he also has a history of hematuria and wishes to avoid oral anticoagulation unless absolutely necessary.    8.  Disposition: Follow-up Lexiscan Myoview.  Follow-up in 1 month or sooner if necessary.  Murray Hodgkins, NP 03/08/2021, 5:28 PM

## 2021-03-16 ENCOUNTER — Encounter
Admission: RE | Admit: 2021-03-16 | Discharge: 2021-03-16 | Disposition: A | Discharge status: Home / Self Care | Payer: Medicare HMO | Source: Ambulatory Visit | Attending: Nurse Practitioner | Admitting: Nurse Practitioner

## 2021-03-16 DIAGNOSIS — R072 Precordial pain: Secondary | ICD-10-CM | POA: Insufficient documentation

## 2021-03-22 ENCOUNTER — Encounter
Admission: RE | Admit: 2021-03-22 | Discharge: 2021-03-22 | Disposition: A | Discharge status: Home / Self Care | Payer: Medicare HMO | Source: Ambulatory Visit | Attending: Nurse Practitioner | Admitting: Nurse Practitioner

## 2021-03-22 ENCOUNTER — Other Ambulatory Visit: Payer: Self-pay

## 2021-03-22 DIAGNOSIS — R072 Precordial pain: Secondary | ICD-10-CM | POA: Diagnosis present

## 2021-03-22 LAB — NM MYOCAR MULTI W/SPECT W/WALL MOTION / EF
Angina Index: 0
Duke Treadmill Score: 7
Estimated workload: 7
Exercise duration (min): 7 min
Exercise duration (sec): 1 s
LV dias vol: 51 mL (ref 62–150)
LV sys vol: 18 mL
MPHR: 155 {beats}/min
Nuc Stress EF: 65 %
Peak HR: 131 {beats}/min
Percent HR: 84 %
Rest HR: 63 {beats}/min
Rest Nuclear Isotope Dose: 10.2 mCi
SDS: 1
SRS: 6
SSS: 1
ST Depression (mm): 0 mm
Stress Nuclear Isotope Dose: 30.3 mCi
TID: 0.66

## 2021-03-22 MED ORDER — TECHNETIUM TC 99M TETROFOSMIN IV KIT
10.2000 | PACK | Freq: Once | INTRAVENOUS | Status: AC | PRN
  Administered 2021-03-22: 10.2 via INTRAVENOUS

## 2021-03-22 MED ORDER — TECHNETIUM TC 99M TETROFOSMIN IV KIT
30.2500 | PACK | Freq: Once | INTRAVENOUS | Status: AC | PRN
  Administered 2021-03-22: 30.25 via INTRAVENOUS

## 2021-03-24 ENCOUNTER — Telehealth: Payer: Self-pay | Admitting: *Deleted

## 2021-03-24 NOTE — Telephone Encounter (Signed)
-----   Message from Creig Hines, NP sent at 03/23/2021  4:35 PM EST ----- Low risk stress test without suggestion for significant reduction of blood flow with exercise.  Reassuring study.

## 2021-03-24 NOTE — Telephone Encounter (Signed)
Left voicemail message to call back for review of results.  

## 2021-03-25 NOTE — Telephone Encounter (Signed)
Patient is returning your call.  

## 2021-03-25 NOTE — Telephone Encounter (Signed)
Reviewed results with patient and confirmed upcoming appointment. He verbalized understanding with no further questions at this time.  

## 2021-04-06 NOTE — Progress Notes (Signed)
Cardiology Office Note  Date:  04/08/2021   ID:  Marcus Bryant, Marcus Bryant 02/11/1956, MRN [id number]  PCP:  Marina Goodell, MD   Chief Complaint  Patient presents with   Other    1 Month fu no complaints today. Meds reviewed verbally with pt.    HPI:  Mr. Cosens is a pleasant 65 year old gentleman with history of  snoring, obstructive sleep apnea, positive sleep study per the patient 2006  does not wear CPAP, sleeps in his stomach, mask would lose suction kidney stones, frequent hyperlipidemia,  who presented to the hospital 09/05/2013 with chest pain, atrial fibrillation. converted to normal sinus rhythm en route,  stress test showed no ischemia, CHADS VASC 1 (would/ be 2  emergency room October 03, 2019 for atrial fibrillation  echocardiogram showed normal ejection fraction with dilated left atrium He presents today for follow-up of his atrial fibrillation, paroxysmal  Last seen in clinic by myself December 2021 At that time no significant atrial fibrillation, was not on metoprolol In the emergency room January 2021 for A. fib  Seen by one of our providers March 08, 2021  Episode of tachycardia shortness of breath November 2022 Stress test as detailed below Was scheduled for urologic procedure, this was canceled, took metoprolol and flecainide  Typically atrial fibrillation presents for a.m. He does not wear CPAP for his sleep apnea  Weight continues to run high Started exercise program  Stress test November 2022, no significant ischemia Images pulled up and reviewed: no coronary calcium, no plaque in aorta  Lab work reviewed Total chol 195 LDL 133  A1c 6.4  Echocardiogram July 2021, normal ejection fraction 60% Moderately dilated left atrium  EKG personally reviewed by myself on todays visit NSR rate 56 bpm no ST or T wave changes  Other past medical hx emergency room October 03, 2019 for atrial fibrillation Converted to normal sinus rhythm on his own At  the same time, "peeing blood", kidney stones Had nausea the night before, was trying to make himself throw up  PMH:   has a past medical history of Arthritis, Chest pain, Complication of anesthesia, COVID-19 (2022), Diastolic dysfunction, History of kidney stones, Hyperlipidemia, Kidney stones, Morbid obesity (HCC), Paroxysmal A-fib (HCC), and Sleep apnea.  PSH:    Past Surgical History:  Procedure Laterality Date   COLONOSCOPY     EXTRACORPOREAL SHOCK WAVE LITHOTRIPSY Left 08/19/2020   Procedure: EXTRACORPOREAL SHOCK WAVE LITHOTRIPSY (ESWL);  Surgeon: Orson Ape, MD;  Location: [facility] ORS;  Service: Urology;  Laterality: Left;   LITHOTRIPSY     x 8   UMBILICAL HERNIA REPAIR N/A 11/10/2020   Procedure: HERNIA REPAIR UMBILICAL ADULT;  Surgeon: Earline Mayotte, MD;  Location: [facility] ORS;  Service: General;  Laterality: N/A;  open umbilical hernia repair    Current Outpatient Medications  Medication Sig Dispense Refill   aspirin 81 MG tablet Take 81 mg by mouth at bedtime.     Ca Carbonate-Mag Hydroxide (ROLAIDS PO) Take 1-2 tablets by mouth daily as needed (heartburn).     flecainide (TAMBOCOR) 100 MG tablet Take 100 mg by mouth 2 (two) times daily as needed.     HYDROcodone-acetaminophen (NORCO/VICODIN) 5-325 MG tablet Take 1 tablet by mouth every 4 (four) hours as needed for moderate pain. 20 tablet 0   ibuprofen (ADVIL) 200 MG tablet Take 400-800 mg by mouth every 6 (six) hours as needed for headache or moderate pain.     Krill Oil 350 MG CAPS Take 350  mg by mouth daily.     meloxicam (MOBIC) 7.5 MG tablet Take 7.5 mg by mouth 2 (two) times daily.     metoprolol succinate (TOPROL-XL) 50 MG 24 hr tablet Take 1 tablet (50 mg total) by mouth daily. Take with or immediately following a meal. 90 tablet 1   metoprolol tartrate (LOPRESSOR) 25 MG tablet Take 1 tablet (25 mg total) by mouth 2 (two) times daily as needed. 60 tablet 3   ondansetron (ZOFRAN ODT) 8 MG disintegrating tablet Take 1  tablet (8 mg total) by mouth every 6 (six) hours as needed for nausea or vomiting. 10 tablet 3   simvastatin (ZOCOR) 80 MG tablet Take 40 mg by mouth daily.     tamsulosin (FLOMAX) 0.4 MG CAPS capsule Take 0.4 mg by mouth every other day.     No current facility-administered medications for this visit.    Allergies:   Patient has no known allergies.   Social History:  The patient  reports that he has never smoked. He has never used smokeless tobacco. He reports that he does not drink alcohol and does not use drugs.   Family History:   family history includes Heart attack (age of onset: 63) in his father; Hypertension in his mother.    Review of Systems: Review of Systems  Constitutional: Negative.   HENT: Negative.    Respiratory: Negative.    Cardiovascular: Negative.   Gastrointestinal: Negative.   Musculoskeletal: Negative.   Neurological: Negative.   Psychiatric/Behavioral: Negative.    All other systems reviewed and are negative.  PHYSICAL EXAM: VS:  BP 120/60 (BP Location: Left Arm, Patient Position: Sitting, Cuff Size: Normal)    Pulse (!) 57    Ht 6' (1.829 m)    Wt 251 lb 2 oz (113.9 kg)    SpO2 97%    BMI 34.06 kg/m  , BMI Body mass index is 34.06 kg/m.  Constitutional:  oriented to person, place, and time. No distress.  HENT:  Head: Grossly normal Eyes:  no discharge. No scleral icterus.  Neck: No JVD, no carotid bruits  Cardiovascular: Regular rate and rhythm, no murmurs appreciated Pulmonary/Chest: Clear to auscultation bilaterally, no wheezes or rails Abdominal: Soft.  no distension.  no tenderness.  Musculoskeletal: Normal range of motion Neurological:  normal muscle tone. Coordination normal. No atrophy Skin: Skin warm and dry Psychiatric: normal affect, pleasant  Recent Labs: 11/08/2020: BUN 15; Creatinine, Ser 0.60; Hemoglobin 14.2; Platelets 161; Potassium 3.7; Sodium 140    Lipid Panel Lab Results  Component Value Date   CHOL 175 09/06/2013   HDL  29 (L) 09/06/2013   LDLCALC 117 (H) 09/06/2013   TRIG 145 09/06/2013    Wt Readings from Last 3 Encounters:  04/08/21 251 lb 2 oz (113.9 kg)  03/08/21 250 lb (113.4 kg)  11/10/20 243 lb (110.2 kg)     ASSESSMENT AND PLAN:  Atrial fibrillation, unspecified type (HCC) Paroxysmal episodes,  emergency room October 03, 2019 Rare episodes since then Continue metoprolol succinate 25 mg daily (bradycardia today and at home, will keep dose 25 mg) Suggested he add flecainide 100 at night Rare epsiodes, CHADS VASC 1 (would be 2 if we include diabetes) Talked about blood thinners Would take  metoprolol tartarte/flecainide 100 mg x 1 for breakthrough Talked about [employer], apple watch  to monitor rhythm  Hyperlipidemia On simvastatin, We have encouraged continued exercise, careful diet management in an effort to lose weight.  Obstructive sleep apnea Unable to tolerate CPAP,  sleeps in stomach Possible trigger for atrial fib Unable to tolerate CPA, poor sleep hygiene   Total encounter time more than 25 minutes  Greater than 50% was spent in counseling and coordination of care with the patient   Orders Placed This Encounter  Procedures   EKG 12-Lead     Signed, Dossie Arbour, M.D., Ph.D. 04/08/2021  [facility] [place], [place] [phone]

## 2021-04-08 ENCOUNTER — Other Ambulatory Visit: Payer: Self-pay | Admitting: Cardiovascular Disease

## 2021-04-08 ENCOUNTER — Other Ambulatory Visit: Payer: Self-pay

## 2021-04-08 ENCOUNTER — Encounter: Payer: Self-pay | Admitting: Cardiovascular Disease

## 2021-04-08 ENCOUNTER — Ambulatory Visit: Payer: Medicare HMO | Admitting: Cardiovascular Disease

## 2021-04-08 VITALS — BP 120/60 | HR 57 | Ht 72.0 in | Wt 251.1 lb

## 2021-04-08 DIAGNOSIS — I10 Essential (primary) hypertension: Secondary | ICD-10-CM | POA: Diagnosis not present

## 2021-04-08 DIAGNOSIS — G4733 Obstructive sleep apnea (adult) (pediatric): Secondary | ICD-10-CM

## 2021-04-08 DIAGNOSIS — I480 Paroxysmal atrial fibrillation: Secondary | ICD-10-CM

## 2021-04-08 DIAGNOSIS — E782 Mixed hyperlipidemia: Secondary | ICD-10-CM | POA: Diagnosis not present

## 2021-04-08 MED ORDER — FLECAINIDE ACETATE 100 MG PO TABS: 100.0000 mg | ORAL_TABLET | Freq: Two times a day (BID) | ORAL | 1 refills | Status: DC | PRN

## 2021-04-08 MED ORDER — METOPROLOL SUCCINATE ER 25 MG PO TB24: 25.0000 mg | ORAL_TABLET | Freq: Every day | ORAL | 3 refills | Status: DC

## 2021-04-08 NOTE — Patient Instructions (Addendum)
Medication Instructions:  No changes  Consider adding flecainide 100 at night and extra as needed  If you need a refill on your cardiac medications before your next appointment, please call your pharmacy.   Lab work: No new labs needed  Testing/Procedures: No new testing needed  Follow-Up: At [facility], you and your health needs are our priority.  As part of our continuing mission to provide you with exceptional heart care, we have created designated Provider Care Teams.  These Care Teams include your primary Cardiologist (physician) and Advanced Practice Providers (APPs -  Physician Assistants and Nurse Practitioners) who all work together to provide you with the care you need, when you need it.  You will need a follow up appointment in 12 months  Providers on your designated Care Team:   Nicolasa Ducking, NP Eula Listen, PA-C Cadence Fransico Michael, [place]  COVID-19 Vaccine Information can be found at: [email or web address] For questions related to vaccine distribution or appointments, please email [email or web address] or call [phone].

## 2021-05-09 ENCOUNTER — Telehealth: Payer: Self-pay | Admitting: Cardiovascular Disease

## 2021-05-09 NOTE — Telephone Encounter (Signed)
° °  Primary Cardiologist: Julien Nordmann, MD  Chart reviewed as part of pre-operative protocol coverage. Given past medical history and time since last visit, based on ACC/AHA guidelines, CORDERIUS SARACENI would be at acceptable risk for the planned procedure without further cardiovascular testing.   I will route this recommendation to the requesting party via Epic fax function and remove from pre-op pool.  Please call with questions.  Thomasene Ripple. Shelsie Tijerino NP-C    05/09/2021, 1:10 PM [facility] [address] Office [phone] Fax [phone]

## 2021-05-09 NOTE — Telephone Encounter (Signed)
° °  Pre-operative Risk Assessment    Patient Name: Marcus Bryant  DOB: 05-24-1955 MRN: [id number]      Request for Surgical Clearance    Procedure:   IVP in office  Date of Surgery:  Clearance 05/10/21                                 Surgeon:  Dr Anola Gurney Surgeon's Group or Practice Name:  [facility] Phone number:  [phone] Fax number:  [phone]   Type of Clearance Requested:   - Medical    Type of Anesthesia:  Not Indicated   Additional requests/questions:    SignedNorman Herrlich   05/09/2021, 12:51 PM

## 2021-06-07 ENCOUNTER — Telehealth: Payer: Self-pay | Admitting: Cardiovascular Disease

## 2021-06-07 NOTE — Telephone Encounter (Signed)
I spoke with the patient.  He advised that he was at the dentist yesterday and was told his HR was running 48 bpm.   He advised he started "feeling funny" about 1 am this morning and went to the ER for further evaluation as his HR was 48 bpm at home.  Per the patient, he was told in the ER that everything looked "ok," aside from his HR still running 48 bpm.   The patient confirms he is still taking flecainide 100 mg BID and metoprolol succinate 25 mg once daily.  I have advised the patient that flecainide will not lower his HR, but the metoprolol succinate can certainly bring that down.  The patient is currently not having any symptoms.  He has also not been consistently checking his HR at home. At his last office visit with Dr. Rockey Situ on 04/08/21, he was running 57 bpm.   I have advised the patient that I would like him to please monitor his HR's consistently over the next few days to see what his HR's are running on his current medications.  He is advised we will call him back on Friday to follow up on how his HR's are running.   The patient advised that he will check his HR TID over the next few days. He is agreeable with the plan as stated above and is aware to call back sooner if needed.   He was very appreciative of the call back.

## 2021-06-07 NOTE — Telephone Encounter (Signed)
STAT if HR is under 50 or over 120 (normal HR is 60-100 beats per minute)  What is your heart rate? 48 at dental office so went to ED yesterday not sure what it is now   Do you have a log of your heart rate readings (document readings)? Patient not sure what HR baseline is doesn't check unless feels like afib  Do you have any other symptoms? Sluggish no energy goes to sleep if at rest but that is not new  Patient wants to know if this expected given he is on metoprolol and flecainide

## 2021-06-10 NOTE — Telephone Encounter (Signed)
Was able to f/u with pt via phone, pt reports has been checking his HR at least twice a day, able to provide an update  2/14 10:45 pm HR 54 2/15 07:10 am HR 57, 6:20 pm HR 64, 11:00 pm HR 59 2/16 07:40 am HR 55, 9:20 pm HR 70 2/17 9:00 am RH 60 Current HR 109  Pt reports is feeling fine, no CP, SOB, fatigue, or other symptoms. Continues to take his medication as prescribe.  Suggest since was seen in the ER for CP and low HR, best to have f/u appt for review and recheck his HR, pt agrees. Appt for 3/14 at 08:20 am with [name]. In the meantime, advised to continue to monitor his HR.  Otherwise all questions were address and no additional concerns at this time. Marcus Bryant thankful for the return call and following-up with him. Agreeable to plan, will call back for anything further.

## 2021-07-04 NOTE — Progress Notes (Unsigned)
Cardiology Office Note  Date:  07/04/2021   ID:  Marcus, Bryant 05-29-1955, MRN [id number]  PCP:  Marina Goodell, MD   No chief complaint on file.   HPI:  [name] is a pleasant 66 year old gentleman with history of  snoring, obstructive sleep apnea, positive sleep study per the patient 2006  does not wear CPAP, sleeps in his stomach, mask would lose suction kidney stones, frequent hyperlipidemia,  who presented to the hospital 09/05/2013 with chest pain, atrial fibrillation. converted to normal sinus rhythm en route,  stress test showed no ischemia, CHADS VASC 1 (would/ be 2  emergency room October 03, 2019 for atrial fibrillation  echocardiogram showed normal ejection fraction with dilated left atrium He presents today for follow-up of his atrial fibrillation, paroxysmal  Last seen in clinic by myself December 2021 At that time no significant atrial fibrillation, was not on metoprolol In the emergency room January 2021 for A. fib  Seen by one of our providers March 08, 2021  Episode of tachycardia shortness of breath November 2022 Stress test as detailed below Was scheduled for urologic procedure, this was canceled, took metoprolol and flecainide  Typically atrial fibrillation presents for a.m. He does not wear CPAP for his sleep apnea  Weight continues to run high Started exercise program  Stress test November 2022, no significant ischemia Images pulled up and reviewed: no coronary calcium, no plaque in aorta  Lab work reviewed Total chol 195 LDL 133  A1c 6.4  Echocardiogram July 2021, normal ejection fraction 60% Moderately dilated left atrium  EKG personally reviewed by myself on todays visit NSR rate 56 bpm no ST or T wave changes  Other past medical hx emergency room October 03, 2019 for atrial fibrillation Converted to normal sinus rhythm on his own At the same time, "peeing blood", kidney stones Had nausea the night before, was trying to make  himself throw up  PMH:   has a past medical history of Arthritis, Chest pain, Complication of anesthesia, COVID-19 (2022), Diastolic dysfunction, History of kidney stones, Hyperlipidemia, Kidney stones, Morbid obesity (HCC), Paroxysmal A-fib (HCC), and Sleep apnea.  PSH:    Past Surgical History:  Procedure Laterality Date   COLONOSCOPY     EXTRACORPOREAL SHOCK WAVE LITHOTRIPSY Left 08/19/2020   Procedure: EXTRACORPOREAL SHOCK WAVE LITHOTRIPSY (ESWL);  Surgeon: Orson Ape, MD;  Location: [facility] ORS;  Service: Urology;  Laterality: Left;   LITHOTRIPSY     x 8   UMBILICAL HERNIA REPAIR N/A 11/10/2020   Procedure: HERNIA REPAIR UMBILICAL ADULT;  Surgeon: Earline Mayotte, MD;  Location: [facility] ORS;  Service: General;  Laterality: N/A;  open umbilical hernia repair    Current Outpatient Medications  Medication Sig Dispense Refill   aspirin 81 MG tablet Take 81 mg by mouth at bedtime.     Ca Carbonate-Mag Hydroxide (ROLAIDS PO) Take 1-2 tablets by mouth daily as needed (heartburn).     flecainide (TAMBOCOR) 100 MG tablet Take 1 tablet (100 mg total) by mouth 2 (two) times daily as needed. 60 tablet 1   HYDROcodone-acetaminophen (NORCO/VICODIN) 5-325 MG tablet Take 1 tablet by mouth every 4 (four) hours as needed for moderate pain. 20 tablet 0   ibuprofen (ADVIL) 200 MG tablet Take 400-800 mg by mouth every 6 (six) hours as needed for headache or moderate pain.     Krill Oil 350 MG CAPS Take 350 mg by mouth daily.     meloxicam (MOBIC) 7.5 MG tablet Take 7.5  mg by mouth 2 (two) times daily.     metoprolol succinate (TOPROL-XL) 25 MG 24 hr tablet Take 1 tablet (25 mg total) by mouth daily. Take with or immediately following a meal. 90 tablet 3   metoprolol tartrate (LOPRESSOR) 25 MG tablet Take 1 tablet (25 mg total) by mouth 2 (two) times daily as needed. 60 tablet 3   ondansetron (ZOFRAN ODT) 8 MG disintegrating tablet Take 1 tablet (8 mg total) by mouth every 6 (six) hours as needed for  nausea or vomiting. 10 tablet 3   simvastatin (ZOCOR) 80 MG tablet Take 40 mg by mouth daily.     tamsulosin (FLOMAX) 0.4 MG CAPS capsule Take 0.4 mg by mouth every other day.     No current facility-administered medications for this visit.    Allergies:   Patient has no known allergies.   Social History:  The patient  reports that he has never smoked. He has never used smokeless tobacco. He reports that he does not drink alcohol and does not use drugs.   Family History:   family history includes Heart attack (age of onset: 60) in his father; Hypertension in his mother.    Review of Systems: Review of Systems  Constitutional: Negative.   HENT: Negative.    Respiratory: Negative.    Cardiovascular: Negative.   Gastrointestinal: Negative.   Musculoskeletal: Negative.   Neurological: Negative.   Psychiatric/Behavioral: Negative.    All other systems reviewed and are negative.  PHYSICAL EXAM: VS:  There were no vitals taken for this visit. , BMI There is no height or weight on file to calculate BMI.  Constitutional:  oriented to person, place, and time. No distress.  HENT:  Head: Grossly normal Eyes:  no discharge. No scleral icterus.  Neck: No JVD, no carotid bruits  Cardiovascular: Regular rate and rhythm, no murmurs appreciated Pulmonary/Chest: Clear to auscultation bilaterally, no wheezes or rails Abdominal: Soft.  no distension.  no tenderness.  Musculoskeletal: Normal range of motion Neurological:  normal muscle tone. Coordination normal. No atrophy Skin: Skin warm and dry Psychiatric: normal affect, pleasant  Recent Labs: 11/08/2020: BUN 15; Creatinine, Ser 0.60; Hemoglobin 14.2; Platelets 161; Potassium 3.7; Sodium 140    Lipid Panel Lab Results  Component Value Date   CHOL 175 09/06/2013   HDL 29 (L) 09/06/2013   LDLCALC 117 (H) 09/06/2013   TRIG 145 09/06/2013    Wt Readings from Last 3 Encounters:  04/08/21 251 lb 2 oz (113.9 kg)  03/08/21 250 lb (113.4  kg)  11/10/20 243 lb (110.2 kg)     ASSESSMENT AND PLAN:  Atrial fibrillation, unspecified type (HCC) Paroxysmal episodes,  emergency room October 03, 2019 Rare episodes since then Continue metoprolol succinate 25 mg daily (bradycardia today and at home, will keep dose 25 mg) Suggested he add flecainide 100 at night Rare epsiodes, CHADS VASC 1 (would be 2 if we include diabetes) Talked about blood thinners Would take  metoprolol tartarte/flecainide 100 mg x 1 for breakthrough Talked about [employer], apple watch  to monitor rhythm  Hyperlipidemia On simvastatin, We have encouraged continued exercise, careful diet management in an effort to lose weight.  Obstructive sleep apnea Unable to tolerate CPAP, sleeps in stomach Possible trigger for atrial fib Unable to tolerate CPA, poor sleep hygiene   Total encounter time more than 25 minutes  Greater than 50% was spent in counseling and coordination of care with the patient   No orders of the defined types were placed  in this encounter.    Signed, Dossie Arbourim Marcea Rojek, M.D., Ph.D. 07/04/2021  [facility], [place] [phone]

## 2021-07-05 ENCOUNTER — Ambulatory Visit: Payer: Medicare HMO | Admitting: Cardiovascular Disease

## 2021-07-05 ENCOUNTER — Encounter: Payer: Self-pay | Admitting: Cardiovascular Disease

## 2021-07-05 ENCOUNTER — Other Ambulatory Visit: Payer: Self-pay

## 2021-07-05 VITALS — BP 123/72 | HR 54 | Ht 72.0 in | Wt 245.0 lb

## 2021-07-05 DIAGNOSIS — I480 Paroxysmal atrial fibrillation: Secondary | ICD-10-CM | POA: Diagnosis not present

## 2021-07-05 DIAGNOSIS — G4733 Obstructive sleep apnea (adult) (pediatric): Secondary | ICD-10-CM

## 2021-07-05 DIAGNOSIS — I10 Essential (primary) hypertension: Secondary | ICD-10-CM

## 2021-07-05 DIAGNOSIS — E782 Mixed hyperlipidemia: Secondary | ICD-10-CM | POA: Diagnosis not present

## 2021-07-05 MED ORDER — FLECAINIDE ACETATE 50 MG PO TABS: 100.0000 mg | ORAL_TABLET | Freq: Two times a day (BID) | ORAL | 3 refills | Status: DC | PRN

## 2021-07-05 NOTE — Patient Instructions (Addendum)
Medication Instructions:  ?We have decrease your flecainide to 50 mg BID as needed ? ?If you need a refill on your cardiac medications before your next appointment, please call your pharmacy.  ? ?Lab work: ?No new labs needed ? ?Testing/Procedures: ?No new testing needed ? ?Follow-Up: ?At [facility], you and your health needs are our priority.  As part of our continuing mission to provide you with exceptional heart care, we have created designated Provider Care Teams.  These Care Teams include your primary Cardiologist (physician) and Advanced Practice Providers (APPs -  Physician Assistants and Nurse Practitioners) who all work together to provide you with the care you need, when you need it. ? ?You will need a follow up appointment in 12 months ? ?Providers on your designated Care Team:   ?Nicolasa Ducking, NP ?Eula Listen, PA-C ?Cadence Fransico Michael, PA-C ? ?COVID-19 Vaccine Information can be found at: PodExchange.nl For questions related to vaccine distribution or appointments, please email [email or web address] or call [phone].  ? ?

## 2021-08-31 ENCOUNTER — Other Ambulatory Visit: Payer: Self-pay | Admitting: Orthopedic Surgery

## 2021-08-31 DIAGNOSIS — M75110 Incomplete rotator cuff tear or rupture of unspecified shoulder, not specified as traumatic: Secondary | ICD-10-CM

## 2021-09-12 ENCOUNTER — Ambulatory Visit
Admission: RE | Admit: 2021-09-12 | Discharge: 2021-09-12 | Disposition: A | Discharge status: Home / Self Care | Payer: Medicare HMO | Source: Ambulatory Visit | Attending: Orthopedic Surgery | Admitting: Orthopedic Surgery

## 2021-09-12 DIAGNOSIS — M75110 Incomplete rotator cuff tear or rupture of unspecified shoulder, not specified as traumatic: Secondary | ICD-10-CM | POA: Insufficient documentation

## 2021-09-14 MED ORDER — PROMETHAZINE HCL 25 MG/ML IJ SOLN: 25.0000 mg | Freq: Once | INTRAMUSCULAR | Status: AC

## 2021-09-14 MED ORDER — DEXTROSE-NACL 5-0.45 % IV SOLN: INTRAVENOUS | Status: DC

## 2021-09-14 MED ORDER — MORPHINE SULFATE (PF) 10 MG/ML IV SOLN: 10.0000 mg | Freq: Once | INTRAVENOUS | Status: AC

## 2021-09-14 MED ORDER — MIDAZOLAM HCL 2 MG/2ML IJ SOLN: 1.0000 mg | Freq: Once | INTRAMUSCULAR | Status: AC

## 2021-09-14 MED ORDER — SODIUM CHLORIDE 0.9 % IV SOLN
1.0000 g | Freq: Once | INTRAVENOUS | Status: DC
  Filled 2021-09-14: qty 10

## 2021-09-15 ENCOUNTER — Ambulatory Visit
Admission: RE | Admit: 2021-09-15 | Discharge: 2021-09-15 | Disposition: A | Discharge status: Home / Self Care | Payer: Medicare HMO | Attending: Urology | Admitting: Urology

## 2021-09-15 ENCOUNTER — Encounter: Payer: Self-pay | Admitting: Urology

## 2021-09-15 ENCOUNTER — Encounter
Admission: RE | Disposition: A | Discharge status: Home / Self Care | Payer: Self-pay | Source: Home / Self Care | Attending: Urology

## 2021-09-15 ENCOUNTER — Other Ambulatory Visit: Payer: Self-pay

## 2021-09-15 DIAGNOSIS — I10 Essential (primary) hypertension: Secondary | ICD-10-CM | POA: Diagnosis not present

## 2021-09-15 DIAGNOSIS — N200 Calculus of kidney: Secondary | ICD-10-CM | POA: Insufficient documentation

## 2021-09-15 DIAGNOSIS — G4730 Sleep apnea, unspecified: Secondary | ICD-10-CM | POA: Insufficient documentation

## 2021-09-15 DIAGNOSIS — I4891 Unspecified atrial fibrillation: Secondary | ICD-10-CM | POA: Insufficient documentation

## 2021-09-15 HISTORY — PX: EXTRACORPOREAL SHOCK WAVE LITHOTRIPSY: SHX1557

## 2021-09-15 SURGERY — LITHOTRIPSY, ESWL
Anesthesia: Moderate Sedation | Laterality: Left

## 2021-09-15 MED ORDER — PROMETHAZINE HCL 25 MG/ML IJ SOLN
INTRAMUSCULAR | Status: AC
  Administered 2021-09-15: 25 mg via INTRAMUSCULAR
  Filled 2021-09-15: qty 1

## 2021-09-15 MED ORDER — MIDAZOLAM HCL 2 MG/2ML IJ SOLN
INTRAMUSCULAR | Status: AC
  Administered 2021-09-15: 1 mg via INTRAMUSCULAR
  Filled 2021-09-15: qty 2

## 2021-09-15 MED ORDER — MORPHINE SULFATE (PF) 10 MG/ML IV SOLN
INTRAVENOUS | Status: AC
  Administered 2021-09-15: 10 mg via INTRAMUSCULAR
  Filled 2021-09-15: qty 1

## 2021-09-15 MED ORDER — FUROSEMIDE 10 MG/ML IJ SOLN
INTRAMUSCULAR | Status: AC
  Filled 2021-09-15: qty 2

## 2021-09-15 MED ORDER — SCOPOLAMINE 1 MG/3DAYS TD PT72
1.0000 | MEDICATED_PATCH | TRANSDERMAL | Status: DC
Start: 2021-09-15 — End: 2021-09-15
  Administered 2021-09-15: 1.5 mg via TRANSDERMAL

## 2021-09-15 MED ORDER — DIPHENHYDRAMINE HCL 25 MG PO CAPS
ORAL_CAPSULE | ORAL | Status: AC
  Administered 2021-09-15: 25 mg
  Filled 2021-09-15: qty 1

## 2021-09-15 MED ORDER — FUROSEMIDE 10 MG/ML IJ SOLN
10.0000 mg | Freq: Once | INTRAMUSCULAR | Status: AC
  Administered 2021-09-15: 10 mg via INTRAVENOUS

## 2021-09-15 MED ORDER — SCOPOLAMINE 1 MG/3DAYS TD PT72
MEDICATED_PATCH | TRANSDERMAL | Status: AC
  Filled 2021-09-15: qty 1

## 2021-09-15 NOTE — Discharge Instructions (Addendum)
Lithotripsy, Care After This sheet gives you information about how to care for yourself after your procedure. Your health care provider may also give you more specific instructions. If you have problems or questions, contact your health care provider. What can I expect after the procedure? After the procedure, it is common to have: Some blood in your urine. This should only last for a few days. Soreness in your back, sides, or upper abdomen for a few days. Blotches or bruises on the area where the shock wave entered the skin. Pain, discomfort, or nausea when pieces (fragments) of the kidney stone move through the tube that carries urine from the kidney to the bladder (ureter). Stone fragments may pass soon after the procedure, but they may continue to pass for up to 4-8 weeks. If you have severe pain or nausea, contact your health care provider. This may be caused by a large stone that was not broken up, and this may mean that you need more treatment. Some pain or discomfort during urination. Some pain or discomfort in the lower abdomen or (in men) at the base of the penis. Follow these instructions at home: Medicines Take over-the-counter and prescription medicines only as told by your health care provider. If you were prescribed an antibiotic medicine, take it as told by your health care provider. Do not stop taking the antibiotic even if you start to feel better. Ask your health care provider if the medicine prescribed to you requires you to avoid driving or using machinery. Eating and drinking A comparison of three sample cups showing dark yellow, yellow, and pale yellow urine.     A plate with examples of foods in a healthy diet.   Drink enough fluid to keep your urine pale yellow. This helps any remaining pieces of the stone to pass. It can also help prevent new stones from forming. Eat plenty of fresh fruits and vegetables. Follow instructions from your health care provider about  eating or drinking restrictions. You may be instructed to: Reduce how much salt (sodium) you eat or drink. Check ingredients and nutrition facts on packaged foods and beverages to see how much sodium they contain. Reduce how much meat you eat. Eat the recommended amount of calcium for your age and gender. Ask your health care provider how much calcium you should have. General instructions Get plenty of rest. Return to your normal activities as told by your health care provider. Ask your health care provider what activities are safe for you. Most people can resume normal activities 1-2 days after the procedure. If you were given a sedative during the procedure, it can affect you for several hours. Do not drive or operate machinery until your health care provider says that it is safe. Your health care provider may direct you to lie in a certain position (postural drainage) and tap firmly (percuss) over your kidney area to help stone fragments pass. Follow instructions as told by your health care provider. If directed, strain all urine through the strainer that was provided by your health care provider. Keep all fragments for your health care provider to see. Any stones that are found may be sent to a medical lab for examination. The stone may be as small as a grain of salt. Keep all follow-up visits as told by your health care provider. This is important. Contact a health care provider if: You have a fever or chills. You have nausea that is severe or does not go away. You have any   of these urinary symptoms: Blood in your urine for longer than your health care provider told you to expect. Urine that smells bad or unusual. Feeling a strong urge to urinate after emptying your bladder. Pain or burning with urination that does not go away. Urinating more often than usual and this does not go away. You have a stent and it comes out. Get help right away if: You have severe pain in your back, sides, or  upper abdomen. You have any of these urinary symptoms: Severe pain while urinating. More blood in your urine or having blood in your urine when you did not before. Passing blood clots in your urine. Passing only a small amount of urine or being unable to pass any urine at all. You have severe nausea that leads to persistent vomiting. You faint. Summary After this procedure, it is common to have some pain, discomfort, or nausea when pieces (fragments) of the kidney stone move through the tube that carries urine from the kidney to the bladder (ureter). If this pain or nausea is severe, however, you should contact your health care provider. Return to your normal activities as told by your health care provider. Ask your health care provider what activities are safe for you. Drink enough fluid to keep your urine pale yellow. This helps any remaining pieces of the stone to pass, and it can help prevent new stones from forming. If directed, strain your urine and keep all fragments for your health care provider to see. Fragments or stones may be as small as a grain of salt. Get help right away if you have severe pain in your back, sides, or upper abdomen, or if you have severe pain while urinating. This information is not intended to replace advice given to you by your health care provider. Make sure you discuss any questions you have with your health care provider. Document Revised: 03/07/2021 Document Reviewed: 12/13/2020 Elsevier Patient Education  2023 Elsevier Inc.     AMBULATORY SURGERY  DISCHARGE INSTRUCTIONS   The drugs that you were given will stay in your system until tomorrow so for the next 24 hours you should not:  Drive an automobile Make any legal decisions Drink any alcoholic beverage   You may resume regular meals tomorrow.  Today it is better to start with liquids and gradually work up to solid foods.  You may eat anything you prefer, but it is better to start with liquids,  then soup and crackers, and gradually work up to solid foods.   Please notify your doctor immediately if you have any unusual bleeding, trouble breathing, redness and pain at the surgery site, drainage, fever, or pain not relieved by medication.    Additional Instructions:     Please contact your physician with any problems or Same Day Surgery at [phone], Monday through Friday 6 am to 4 pm, or [place] [facility] [place] Main number at [phone].  

## 2021-09-16 ENCOUNTER — Encounter: Payer: Self-pay | Admitting: Urology

## 2022-04-09 ENCOUNTER — Other Ambulatory Visit: Payer: Self-pay | Admitting: Cardiovascular Disease

## 2022-07-02 ENCOUNTER — Other Ambulatory Visit: Payer: Self-pay | Admitting: Cardiovascular Disease

## 2022-07-03 NOTE — Telephone Encounter (Signed)
Please schedule 1 yr F/U for further refills. Thank you!

## 2022-07-03 NOTE — Telephone Encounter (Signed)
Pt scheduled on 7/1

## 2022-07-03 NOTE — Telephone Encounter (Signed)
Is there nothing available any sooner? He is due for F/U now and needs to be seen for refills of antiarrythmic drug. Thanks!

## 2022-07-03 NOTE — Telephone Encounter (Signed)
Pt was unable to come to any of the other available appointments and did not want anything in the afternoon. July 1 at 8am was the first morning appt available and I added him to the wait list.

## 2022-07-04 NOTE — Telephone Encounter (Signed)
See below. Please advise if OK to refill until seen in July 2024. Thank you!

## 2022-07-06 ENCOUNTER — Other Ambulatory Visit: Payer: Self-pay | Admitting: Cardiovascular Disease

## 2022-09-04 ENCOUNTER — Other Ambulatory Visit: Payer: Self-pay | Admitting: Cardiovascular Disease

## 2022-10-04 ENCOUNTER — Other Ambulatory Visit: Payer: Self-pay | Admitting: Cardiovascular Disease

## 2022-10-22 NOTE — Progress Notes (Unsigned)
Cardiology Office Note  Date:  10/23/2022   ID:  Marcus, Bryant 03-18-1956, MRN [id number]  PCP:  Marina Goodell, MD   Chief Complaint  Patient presents with   12 month follow up     "Doing well." Medications reviewed by the patient verbally.     HPI:  [name] is a pleasant 67 year old gentleman with history of  snoring, obstructive sleep apnea, positive sleep study per the patient 2006  does not wear CPAP, sleeps in his stomach, mask would lose suction kidney stones, frequent hyperlipidemia,  hospital 09/05/2013 with chest pain, atrial fibrillation, converted to normal sinus rhythm en route, stress test showed no ischemia, CHADS VASC 1 (would/ be 2  emergency room October 03, 2019 for atrial fibrillation  echocardiogram showed normal ejection fraction with dilated left atrium He presents today for follow-up of his atrial fibrillation, paroxysmal  Last seen in clinic by myself March 2023 No atrial fib sx on flecainide Taking flecainide 50 mg daily, not twice a day, does not feel that he needs more On CPAP, feels worse, frequent waking, sleeps 4 hr per night Pulling mask off to get air 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, they are making adjustments on his settings  Does not have Apple watch to track A-fib  Reports he has a new girlfriend, eating lots of pineapple pudding A1c trending higher  Work reviewed A1c 7.0 up from 6.3 Total cholesterol 176 LDL 110  EKG personally reviewed by myself on todays visit EKG Interpretation Date/Time:  Monday October 23 2022 08:11:38 EDT Ventricular Rate:  57 PR Interval:  174 QRS Duration:  94 QT Interval:  420 QTC Calculation: 408 R Axis:   50  Text Interpretation: Sinus bradycardia When compared with ECG of 08-Nov-2020 08:15, No significant change was found Confirmed by Marcus Bryant [phone]) on 10/23/2022 8:21:09 AM    Other past medical history reviewed emergency room January 2021 for A. fib  Seen by one of  our providers March 08, 2021  Episode of tachycardia shortness of breath November 2022 Was scheduled for urologic procedure, this was canceled, took metoprolol and flecainide  Typically atrial fibrillation presents for a.m. He does not wear CPAP for his sleep apnea  Weight continues to run high Started exercise program  Stress test November 2022, no significant ischemia no coronary calcium, no plaque in aorta  Echocardiogram July 2021, normal ejection fraction 60% Moderately dilated left atrium  Other past medical hx emergency room October 03, 2019 for atrial fibrillation Converted to normal sinus rhythm on his own At the same time, "peeing blood", kidney stones Had nausea the night before, was trying to make himself throw up  PMH:   has a past medical history of Arthritis, Chest pain, Complication of anesthesia, COVID-19 (2022), Diastolic dysfunction, History of kidney stones, Hyperlipidemia, Kidney stones, Morbid obesity (HCC), Paroxysmal A-fib (HCC), and Sleep apnea.  PSH:    Past Surgical History:  Procedure Laterality Date   COLONOSCOPY     EXTRACORPOREAL SHOCK WAVE LITHOTRIPSY Left 08/19/2020   Procedure: EXTRACORPOREAL SHOCK WAVE LITHOTRIPSY (ESWL);  Surgeon: Orson Ape, MD;  Location: [facility] ORS;  Service: Urology;  Laterality: Left;   EXTRACORPOREAL SHOCK WAVE LITHOTRIPSY Left 09/15/2021   Procedure: EXTRACORPOREAL SHOCK WAVE LITHOTRIPSY (ESWL);  Surgeon: Orson Ape, MD;  Location: [facility] ORS;  Service: Urology;  Laterality: Left;   LITHOTRIPSY     x 8   UMBILICAL HERNIA REPAIR N/A 11/10/2020   Procedure: HERNIA REPAIR  UMBILICAL ADULT;  Surgeon: Earline Mayotte, MD;  Location: [facility] ORS;  Service: General;  Laterality: N/A;  open umbilical hernia repair    Current Outpatient Medications  Medication Sig Dispense Refill   aspirin 81 MG tablet Take 81 mg by mouth at bedtime.     Ca Carbonate-Mag Hydroxide (ROLAIDS PO) Take 1-2 tablets by mouth daily as needed  (heartburn).     flecainide (TAMBOCOR) 50 MG tablet TAKE 1 TABLET BY MOUTH 2 TIMES DAILY AS NEEDED. 180 tablet 0   ibuprofen (ADVIL) 200 MG tablet Take 400-800 mg by mouth every 6 (six) hours as needed for headache or moderate pain.     Krill Oil 350 MG CAPS Take 350 mg by mouth daily.     meloxicam (MOBIC) 7.5 MG tablet Take 7.5 mg by mouth daily.     metoprolol succinate (TOPROL-XL) 25 MG 24 hr tablet TAKE 1 TABLET BY MOUTH EVERY DAY WITH OR IMMEDIATELY FOLLOWING A MEAL 30 tablet 0   metoprolol tartrate (LOPRESSOR) 25 MG tablet Take 1 tablet (25 mg total) by mouth 2 (two) times daily as needed. 60 tablet 3   ondansetron (ZOFRAN ODT) 8 MG disintegrating tablet Take 1 tablet (8 mg total) by mouth every 6 (six) hours as needed for nausea or vomiting. 10 tablet 3   simvastatin (ZOCOR) 80 MG tablet Take 40 mg by mouth daily.     tadalafil (CIALIS) 5 MG tablet Take by mouth as needed.     tamsulosin (FLOMAX) 0.4 MG CAPS capsule Take 0.4 mg by mouth daily.     No current facility-administered medications for this visit.    Allergies:   Patient has no known allergies.   Social History:  The patient  reports that he has never smoked. He has never used smokeless tobacco. He reports that he does not drink alcohol and does not use drugs.   Family History:   family history includes Heart attack (age of onset: 58) in his father; Hypertension in his mother.   Review of Systems: Review of Systems  Constitutional: Negative.   HENT: Negative.    Respiratory: Negative.    Cardiovascular: Negative.   Gastrointestinal: Negative.   Musculoskeletal: Negative.   Neurological: Negative.   Psychiatric/Behavioral: Negative.    All other systems reviewed and are negative.   PHYSICAL EXAM: VS:  BP 118/64 (BP Location: Left Arm, Patient Position: Sitting, Cuff Size: Normal)   Pulse (!) 57   Ht 6' (1.829 m)   Wt 246 lb 6 oz (111.8 kg)   SpO2 93%   BMI 33.41 kg/m  , BMI Body mass index is 33.41 kg/m.   Constitutional:  oriented to person, place, and time. No distress.  HENT:  Head: Grossly normal Eyes:  no discharge. No scleral icterus.  Neck: No JVD, no carotid bruits  Cardiovascular: Regular rate and rhythm, no murmurs appreciated Pulmonary/Chest: Clear to auscultation bilaterally, no wheezes or rails Abdominal: Soft.  no distension.  no tenderness.  Musculoskeletal: Normal range of motion Neurological:  normal muscle tone. Coordination normal. No atrophy Skin: Skin warm and dry Psychiatric: normal affect, pleasant  Recent Labs: No results found for requested labs within last 365 days.    Lipid Panel Lab Results  Component Value Date   CHOL 175 09/06/2013   HDL 29 (L) 09/06/2013   LDLCALC 117 (H) 09/06/2013   TRIG 145 09/06/2013    Wt Readings from Last 3 Encounters:  10/23/22 246 lb 6 oz (111.8 kg)  09/15/21 236 lb (  107 kg)  07/05/21 245 lb (111.1 kg)     ASSESSMENT AND PLAN:  Atrial fibrillation, unspecified type (HCC) Reports he started wearing CPAP but difficulty tolerating Denies A-fib episodes Rare reported episodes of A-fib CHADS VASC 2 Talked about blood thinners, declining at this time Discussed a mechanism such as Apple Watch to monitor her heart rhythm at home for atrial fibrillation episodes  Hyperlipidemia Continue statin  Obstructive sleep apnea Difficulty tolerating CPAP, recommend he talk with his respiratory group about trial of BiPAP We have offered referral to pulmonary, he has declined at this time  Diabetes type 2 A1C 7 We have encouraged continued exercise, careful diet management in an effort to lose weight.   Total encounter time more than 40 minutes  Greater than 50% was spent in counseling and coordination of care with the patient   Orders Placed This Encounter  Procedures   EKG 12-Lead     Signed, Dossie Arbour, M.D., Ph.D. 10/23/2022  [facility] [place], [place] [phone]

## 2022-10-23 ENCOUNTER — Ambulatory Visit: Payer: Medicare HMO | Attending: Cardiovascular Disease | Admitting: Cardiovascular Disease

## 2022-10-23 ENCOUNTER — Encounter: Payer: Self-pay | Admitting: Cardiovascular Disease

## 2022-10-23 VITALS — BP 118/64 | HR 57 | Ht 72.0 in | Wt 246.4 lb

## 2022-10-23 DIAGNOSIS — E782 Mixed hyperlipidemia: Secondary | ICD-10-CM

## 2022-10-23 DIAGNOSIS — I10 Essential (primary) hypertension: Secondary | ICD-10-CM | POA: Diagnosis not present

## 2022-10-23 DIAGNOSIS — I480 Paroxysmal atrial fibrillation: Secondary | ICD-10-CM | POA: Diagnosis not present

## 2022-10-23 DIAGNOSIS — G4733 Obstructive sleep apnea (adult) (pediatric): Secondary | ICD-10-CM

## 2022-10-23 MED ORDER — METOPROLOL SUCCINATE ER 25 MG PO TB24: ORAL_TABLET | ORAL | 3 refills | Status: DC

## 2022-10-23 MED ORDER — FLECAINIDE ACETATE 50 MG PO TABS: ORAL_TABLET | ORAL | 3 refills | Status: DC

## 2022-10-23 NOTE — Patient Instructions (Addendum)
Ask respiratory group about changing Cpap to Bipap   Medication Instructions:  Flecainide 50 mg daily with extra as needed  If you need a refill on your cardiac medications before your next appointment, please call your pharmacy.   Lab work: No new labs needed  Testing/Procedures: No new testing needed  Follow-Up: At [facility], you and your health needs are our priority.  As part of our continuing mission to provide you with exceptional heart care, we have created designated Provider Care Teams.  These Care Teams include your primary Cardiologist (physician) and Advanced Practice Providers (APPs -  Physician Assistants and Nurse Practitioners) who all work together to provide you with the care you need, when you need it.  You will need a follow up appointment in 12 months  Providers on your designated Care Team:   Nicolasa Ducking, NP Eula Listen, PA-C Cadence Fransico Michael, [place]  COVID-19 Vaccine Information can be found at: [email or web address].nl For questions related to vaccine distribution or appointments, please email [email or web address] or call [phone].

## 2023-04-12 ENCOUNTER — Ambulatory Visit: Payer: Medicare HMO | Attending: Internal Medicine | Admitting: Internal Medicine

## 2023-04-12 ENCOUNTER — Telehealth: Payer: Self-pay | Admitting: Cardiovascular Disease

## 2023-04-12 ENCOUNTER — Encounter: Payer: Self-pay | Admitting: Internal Medicine

## 2023-04-12 VITALS — BP 127/74 | HR 62 | Ht 72.0 in | Wt 243.6 lb

## 2023-04-12 DIAGNOSIS — I480 Paroxysmal atrial fibrillation: Secondary | ICD-10-CM | POA: Diagnosis not present

## 2023-04-12 MED ORDER — FLECAINIDE ACETATE 50 MG PO TABS: 50.0000 mg | ORAL_TABLET | Freq: Two times a day (BID) | ORAL | 0 refills | Status: DC

## 2023-04-12 NOTE — Telephone Encounter (Signed)
The patient with call into STAT phone - c/o "pain and SOB" with variability of HR 105 with clear reading and 175 with "poor connection" as measured from iPhone watch last night approx 2 am.  Pt unable to clearly describe pain except to say the discomfort of "heart beating out of his chest" would come and go from 0-10 pt scaled as "one to five at the worst"  Pt reports history of afib "four episodes within the last seven years", pt reports that "Dr Mariah Milling said if the afib came back to come to the office and we would get a EKG"  Pt denies additional s/sx: sweating, N/V, radiation pain and HA, pt reports HR is 85 at this time, pt denies caffeine abuse but reports the death of his brother-in-law last week has been stressful  Pt given open slot and reports he will be here at 9:40

## 2023-04-12 NOTE — Telephone Encounter (Signed)
Patient c/o Palpitations:  STAT if patient reporting lightheadedness, shortness of breath, or chest pain  How long have you had palpitations/irregular HR/ Afib? Are you having the symptoms now? Last night. Yes  Are you currently experiencing lightheadedness, SOB or CP? Cp    Do you have a history of afib (atrial fibrillation) or irregular heart rhythm? yes  Have you checked your BP or HR? (document readings if available): no  Are you experiencing any other symptoms? No

## 2023-04-12 NOTE — Patient Instructions (Addendum)
Medication Instructions:  INCREASE the Flecainide 50 mg twice daily  *If you need a refill on your cardiac medications before your next appointment, please call your pharmacy*   Lab Work: Your provider would like for you to have following labs drawn today BMET, Magnesium, TSH.   If you have labs (blood work) drawn today and your tests are completely normal, you will receive your results only by: MyChart Message (if you have MyChart) OR A paper copy in the mail If you have any lab test that is abnormal or we need to change your treatment, we will call you to review the results.   Testing/Procedures: None ordered   Follow-Up: At [facility], you and your health needs are our priority.  As part of our continuing mission to provide you with exceptional heart care, we have created designated Provider Care Teams.  These Care Teams include your primary Cardiologist (physician) and Advanced Practice Providers (APPs -  Physician Assistants and Nurse Practitioners) who all work together to provide you with the care you need, when you need it.  We recommend signing up for the patient portal called "MyChart".  Sign up information is provided on this After Visit Summary.  MyChart is used to connect with patients for Virtual Visits (Telemedicine).  Patients are able to view lab/test results, encounter notes, upcoming appointments, etc.  Non-urgent messages can be sent to your provider as well.   To learn more about what you can do with MyChart, go to [email or web address].    Your next appointment:   1 month(s)  Provider:   You may see Julien Nordmann, MD or one of the following Advanced Practice Providers on your designated Care Team:   Nicolasa Ducking, NP Eula Listen, PA-C Cadence Fransico Michael, PA-C Charlsie Quest, NP [name], NP

## 2023-04-12 NOTE — Progress Notes (Signed)
Cardiology Office Note:  .   Date:  04/12/2023  ID:  Marcus Bryant, DOB 26-Jun-1955, MRN [id number] PCP: Marcus Goodell, MD  [facility] Providers Cardiologist:  Marcus Nordmann, MD     History of Present Illness: .   Marcus Bryant is a 67 y.o. male with history of paroxysmal atrial fibrillation, type 2 diabetes mellitus, hyperlipidemia, and obstructive sleep apnea, who presents for urgent evaluation of palpitations and chest pain.  He was last seen by Dr. Mariah Bryant in July, at which time he denied palpitations.  He continued to take flecainide 50 mg twice daily.  Hemoglobin A1c has been trending up, which he attributed to dietary indiscretion.  Anticoagulation has been deferred in the past due to frequent nephrolithiasis and associated intermittent gross hematuria.  Today, Marcus Bryant reports that he was feeling fairly well until yesterday evening.  He was returning home from dinner at a friend's house when he began to feel "funny."  He then felt as though his heart were beating faster than normal.  He took his regular medications at 10 PM and felt a little bit better.  He had recently procured an Apple Watch and took several rhythm strips that showed atrial fibrillation.  Personal review of the strips confirms atrial fibrillation with measured heart rate of up to 128 bpm.  He denies frank chest pain as well as shortness of breath and lightheadedness.  He has been compliant with his medications including once daily metoprolol and flecainide.  He thinks it would be difficult to take twice daily medications on a regular basis.  He reached out to our office this morning complaining of palpitations and was instructed to take an extra dose of flecainide.  Within 30 minutes, his palpitations resolved.  ROS: See HPI  Studies Reviewed: Marcus Bryant Kitchen   EKG Interpretation Date/Time:  Thursday April 12 2023 09:58:36 EST Ventricular Rate:  62 PR Interval:  184 QRS Duration:  98 QT Interval:  400 QTC  Calculation: 406 R Axis:   8  Text Interpretation: Normal sinus rhythm Normal ECG When compared with ECG of 23-Oct-2022 No significant change was found Confirmed by Marcus Bryant, Marcus Bryant [phone]) on 04/12/2023 10:07:28 AM    Risk Assessment/Calculations:    CHA2DS2-VASc Score = 2   This indicates a 2.2% annual risk of stroke. The patient's score is based upon: CHF History: 0 HTN History: 0 Diabetes History: 1 Stroke History: 0 Vascular Disease History: 0 Age Score: 1 Gender Score: 0            Physical Exam:   VS:  BP 127/74 (BP Location: Left Arm, Patient Position: Sitting, Cuff Size: Normal)   Pulse 62   Ht 6' (1.829 m)   Wt 243 lb 9.6 oz (110.5 kg)   SpO2 98%   BMI 33.04 kg/m    Wt Readings from Last 3 Encounters:  04/12/23 243 lb 9.6 oz (110.5 kg)  10/23/22 246 lb 6 oz (111.8 kg)  09/15/21 236 lb (107 kg)    General:  NAD. Neck: No JVD or HJR. Lungs: Clear to auscultation bilaterally without wheezes or crackles. Heart: Regular rate and rhythm without murmurs, rubs, or gallops. Abdomen: Soft, nontender, nondistended. Extremities: No lower extremity edema.  ASSESSMENT AND PLAN: .    Paroxysmal atrial fibrillation: Marcus Bryant had an episode of atrial fibrillation yesterday evening though it appears to have stopped shortly after 9 AM after taking an additional dose of flecainide.  He is currently only on flecainide 50 mg daily.  I think he would have better long-term efficacy by taking flecainide 50 mg twice daily.  His EKG today shows normal QRS duration and QT interval.  He will continue his current dose of metoprolol.  We discussed role for anticoagulation in the setting of a CHA2DS2-VASc score of 2, though he wishes to continue deferral at this time.  I will have him follow-up with Dr. Mariah Bryant or an APP in about a month.  I would have a low threshold for referring Marcus Bryant to EP to discuss other rhythm control strategies, particularly if he has recurrent atrial fibrillation  or difficulties with adherence to twice daily medications.  Would also be worthwhile to explore left atrial appendage occlusion given concerns surrounding hematuria and anticoagulation.  I will check a BMP, magnesium, and TSH today.  Marcus Bryant does not consume alcohol and was encouraged to remain abstinent.    Dispo: Return to clinic in 1 month with Dr. Mariah Bryant or APP.  Signed, Marcus Kendall, MD

## 2023-04-13 LAB — BASIC METABOLIC PANEL
BUN/Creatinine Ratio: 22 (ref 10–24)
BUN: 17 mg/dL (ref 8–27)
CO2: 24 mmol/L (ref 20–29)
Calcium: 9.8 mg/dL (ref 8.6–10.2)
Chloride: 103 mmol/L (ref 96–106)
Creatinine, Ser: 0.78 mg/dL (ref 0.76–1.27)
Glucose: 184 mg/dL — ABNORMAL HIGH (ref 70–99)
Potassium: 4.3 mmol/L (ref 3.5–5.2)
Sodium: 143 mmol/L (ref 134–144)
eGFR: 98 mL/min/{1.73_m2} (ref >59)

## 2023-04-13 LAB — TSH: TSH: 2.26 u[IU]/mL (ref 0.450–4.500)

## 2023-04-13 LAB — MAGNESIUM: Magnesium: 1.9 mg/dL (ref 1.6–2.3)

## 2023-04-30 DIAGNOSIS — R310 Gross hematuria: Secondary | ICD-10-CM | POA: Diagnosis not present

## 2023-04-30 DIAGNOSIS — N200 Calculus of kidney: Secondary | ICD-10-CM | POA: Diagnosis not present

## 2023-05-13 NOTE — Progress Notes (Unsigned)
Cardiology Clinic Note   Date: 05/15/2023 ID: Marcus, Bryant January 17, 1956, MRN [id number]  Primary Cardiologist:  Julien Nordmann, MD  Patient Profile    Marcus Bryant is a 68 y.o. male who presents to the clinic today for follow up after medication changes.     Past medical history significant for: Chest pain. Nuclear stress test 03/22/2021: Low risk probably normal stress test.  Small in size, mild in severity, fixed apical lateral and apical inferior defect that most likely represents artifact but cannot exclude scar.  There is no evidence of significant ischemia.  No significant coronary artery calcification observed. PAF. Onset May 2015. Echo 11/07/2019: EF 60 to 65%.  No RWMA.  Mild LVH.  Grade II DD.  Normal RV size/function.  Moderate LAE. Hyperlipidemia. Lipid panel 03/12/2023: LDL 114, HDL 36, TG 102, total 171. OSA. T2DM. Nephrolithiasis.  In summary, patient presented to the ED for chest pain in May 2015 and was found to be in A-fib.  He converted to sinus rhythm.  Stress test did was low risk with normal LV function.  CHA2DS2-VASc was 0 at that time.  He was seen in the ED January 2021 for A-fib and converted spontaneously.  In June 2021 he was started on flecainide.  Echo July 2021 showed EF 60 to 65% as detailed above.  Patient was seen urgently in the office in November 2022 secondary to sudden onset of tachypalpitations and dyspnea during scheduled IVP with urology.  He reported upon laying down for the test he had sudden onset of symptoms along with mild chest burning.  Symptoms improved when he sat up and then stood.  He was instructed to report to the ED but instead went home and took a flecainide and short acting metoprolol.  He was in sinus rhythm and asymptomatic at the time of his office visit.  He noted due to a dye allergy he had taken prednisone the day prior.  He had a normal, low risk nuclear stress test at that time.  Anticoagulation had been deferred in  the past due to frequent nephrolithiasis and associated intermittent gross hematuria.     History of Present Illness    Marcus Bryant is followed by [name] for the above outlined history.  Patient was last seen in the office by [name] on 04/12/2023 for urgent evaluation of palpitations and chest pain.  Patient reported the evening prior he felt his heart was beating faster than normal.  He took his regular medications at 10 PM and felt a little bit better.  His Apple Watch showed A-fib on several rhythm strips with heart rate up to 128 bpm.  He reported compliance with once daily dosing of metoprolol and flecainide.  He felt twice daily dosing on a regular basis would be difficult for him.  When he contacted the office to schedule the visit he was instructed to take an extra dose of flecainide and palpitations resolved within 30 minutes.  EKG at the time of his visit showed normal QRS and QT interval.  He was started on flecainide twice daily and scheduled for close follow-up.  He continued to defer anticoagulation.  Today, patient is doing well. He has not felt much of a difference since increasing flecainide. He has set an alarm on his phone so he can remember to take it twice a day. He has forgotten to take it or missed a dose a couple of times since it was increased.  He has  not had any more episodes of tachypalpitations since the increase. He continues to monitor his rhythm with his Apple watch. He was diagnosed with afib about 10 years ago and has always been aware when he goes into it. He did notice his Apple watch reports a higher burden of afib on the days he works. He does some walking and lifting while at work but he never feels as though he is in afib. Discussed referral to EP. He would like to keep his visit with [name] next month and discuss it with him.     ROS: All other systems reviewed and are otherwise negative except as noted in History of Present Illness.  EKGs/Labs  Reviewed    EKG Interpretation Date/Time:  Tuesday May 15 2023 09:00:00 EST Ventricular Rate:  54 PR Interval:  196 QRS Duration:  96 QT Interval:  420 QTC Calculation: 398 R Axis:   20  Text Interpretation: Sinus bradycardia Low voltage QRS When compared with ECG of 12-Apr-2023 09:58, Minimal criteria for Inferior infarct are no longer Present Confirmed by [name] [phone]) on 05/15/2023 9:04:14 AM   04/12/2023: BUN 17; Creatinine, Ser 0.78; Potassium 4.3; Sodium 143   04/12/2023: TSH 2.260    Risk Assessment/Calculations     CHA2DS2-VASc Score = 2   This indicates a 2.2% annual risk of stroke. The patient's score is based upon: CHF History: 0 HTN History: 0 Diabetes History: 1 Stroke History: 0 Vascular Disease History: 0 Age Score: 1 Gender Score: 0             Physical Exam    VS:  BP 118/70 (BP Location: Left Arm, Patient Position: Sitting, Cuff Size: Large)   Pulse (!) 54   Ht 6' (1.829 m)   Wt 247 lb 2 oz (112.1 kg)   SpO2 98%   BMI 33.52 kg/m  , BMI Body mass index is 33.52 kg/m.  GEN: Well nourished, well developed, in no acute distress. Neck: No JVD or carotid bruits. Cardiac:  RRR. No murmurs. No rubs or gallops.   Respiratory:  Respirations regular and unlabored. Clear to auscultation without rales, wheezing or rhonchi. GI: Soft, nontender, nondistended. Extremities: Radials/DP/PT 2+ and equal bilaterally. No clubbing or cyanosis. No edema.  Skin: Warm and dry, no rash. Neuro: Strength intact.  Assessment & Plan   PAF Onset May 2015.  Echo July 2021 showed normal LV/RV function, mild LVH, moderate LAE.  CHA2DS2-VASc 2.  Patient reports no further episodes of tachypalpitations since increase in flecainide. He has missed the second dose a couple of time but has an alarm set on his phone to remind him to take it. EKG today shows sinus bradycardia, with normal QT interval and QRS duration.  -Continue Toprol, flecainide twice daily, as  needed Lopressor. -Patient would like to defer EP referral until he discussed with [name] next month.   Hyperlipidemia LDL November 2024 114. -Continue simvastatin. -Continue to follow with PCP.  Disposition: Return for previously scheduled visit with [name] per patient request.          Signed, Etta Grandchild. Lavontae Cornia, DNP, NP-C

## 2023-05-15 ENCOUNTER — Encounter: Payer: Self-pay | Admitting: Student

## 2023-05-15 ENCOUNTER — Ambulatory Visit: Payer: HMO | Attending: Student | Admitting: Student

## 2023-05-15 VITALS — BP 118/70 | HR 54 | Ht 72.0 in | Wt 247.1 lb

## 2023-05-15 DIAGNOSIS — E782 Mixed hyperlipidemia: Secondary | ICD-10-CM

## 2023-05-15 DIAGNOSIS — I480 Paroxysmal atrial fibrillation: Secondary | ICD-10-CM

## 2023-05-15 NOTE — Patient Instructions (Signed)
Medication Instructions:  Your physician recommends that you continue on your current medications as directed. Please refer to the Current Medication list given to you today.  *If you need a refill on your cardiac medications before your next appointment, please call your pharmacy*   Follow-Up: At [facility], you and your health needs are our priority.  As part of our continuing mission to provide you with exceptional heart care, we have created designated Provider Care Teams.  These Care Teams include your primary Cardiologist (physician) and Advanced Practice Providers (APPs -  Physician Assistants and Nurse Practitioners) who all work together to provide you with the care you need, when you need it.  We recommend signing up for the patient portal called "MyChart".  Sign up information is provided on this After Visit Summary.  MyChart is used to connect with patients for Virtual Visits (Telemedicine).  Patients are able to view lab/test results, encounter notes, upcoming appointments, etc.  Non-urgent messages can be sent to your provider as well.   To learn more about what you can do with MyChart, go to [email or web address].    Your next appointment:   Tuesday, 2/18 @ 8:50am   Provider:   Julien Nordmann, MD    Other Instructions

## 2023-06-05 DIAGNOSIS — E7800 Pure hypercholesterolemia, unspecified: Secondary | ICD-10-CM | POA: Diagnosis not present

## 2023-06-05 DIAGNOSIS — E1129 Type 2 diabetes mellitus with other diabetic kidney complication: Secondary | ICD-10-CM | POA: Diagnosis not present

## 2023-06-05 DIAGNOSIS — R809 Proteinuria, unspecified: Secondary | ICD-10-CM | POA: Diagnosis not present

## 2023-06-05 DIAGNOSIS — I480 Paroxysmal atrial fibrillation: Secondary | ICD-10-CM | POA: Diagnosis not present

## 2023-06-11 NOTE — Progress Notes (Unsigned)
Cardiology Office Note  Date:  06/12/2023   ID:  Marcus, Bryant [birth year], MRN [id number]  PCP:  Marina Goodell, MD   Chief Complaint  Patient presents with   2 month follow up     "Doing well."     HPI:  [name] is a pleasant 68 year old gentleman with history of  snoring, obstructive sleep apnea, positive sleep study per the patient 2006  does not wear CPAP, sleeps on his stomach, mask would lose suction kidney stones, frequent, hematuria hyperlipidemia,  hospital 09/05/2013 with chest pain, atrial fibrillation, converted to normal sinus rhythm en route, stress test showed no ischemia, CHADS VASC 1 (would/ be 2  emergency room October 03, 2019 for atrial fibrillation  echocardiogram showed normal ejection fraction with dilated left atrium Not on anticoagulation secondary to hematuria He presents today for follow-up of his atrial fibrillation, paroxysmal  Last seen in clinic by myself 7/24 Seen December 2020 for A-fib Seen by one of our providers January 2025 On flecainide Toprol And anticoagulation Instructed to increase flecainide BID  EKG June 12, 2023 normal sinus rhythm EKG May 15, 2023 normal sinus rhythm EKG April 12, 2023 normal sinus rhythm EKG October 23, 2022 normal sinus rhythm  Still working on cars  Not on CPAP 2 months Sleeps better without it, throat was dry and sore On CPAP takes >1 hr to sleep  Has apple watch Reads 2% on phone for afib burden Higher on work days, works on trucks 2 days a week  Work reviewed A1c 6.9 Total cholesterol 171 LDL 114  EKG personally reviewed by myself on todays visit EKG Interpretation Date/Time:  Tuesday June 12 2023 08:49:44 EST Ventricular Rate:  57 PR Interval:  190 QRS Duration:  96 QT Interval:  426 QTC Calculation: 414 R Axis:   59  Text Interpretation: Sinus bradycardia Low voltage QRS When compared with ECG of 15-May-2023 09:00, No significant change was found Confirmed by  Julien Nordmann [phone]) on 06/12/2023 8:52:12 AM   Other past medical history reviewed emergency room January 2021 for A. fib  Seen by one of our providers March 08, 2021  Episode of tachycardia shortness of breath November 2022 Was scheduled for urologic procedure, this was canceled, took metoprolol and flecainide  Stress test November 2022, no significant ischemia no coronary calcium, no plaque in aorta  Echocardiogram July 2021, normal ejection fraction 60% Moderately dilated left atrium  Other past medical hx emergency room October 03, 2019 for atrial fibrillation Converted to normal sinus rhythm on his own At the same time, "peeing blood", kidney stones Had nausea the night before, was trying to make himself throw up  PMH:   has a past medical history of Arthritis, Chest pain, Complication of anesthesia, COVID-19 (2022), Diastolic dysfunction, History of kidney stones, Hyperlipidemia, Kidney stones, Morbid obesity (HCC), Paroxysmal A-fib (HCC), and Sleep apnea.  PSH:    Past Surgical History:  Procedure Laterality Date   COLONOSCOPY     EXTRACORPOREAL SHOCK WAVE LITHOTRIPSY Left 08/19/2020   Procedure: EXTRACORPOREAL SHOCK WAVE LITHOTRIPSY (ESWL);  Surgeon: Orson Ape, MD;  Location: [facility] ORS;  Service: Urology;  Laterality: Left;   EXTRACORPOREAL SHOCK WAVE LITHOTRIPSY Left 09/15/2021   Procedure: EXTRACORPOREAL SHOCK WAVE LITHOTRIPSY (ESWL);  Surgeon: Orson Ape, MD;  Location: [facility] ORS;  Service: Urology;  Laterality: Left;   LITHOTRIPSY     x 8   UMBILICAL HERNIA REPAIR N/A 11/10/2020   Procedure: HERNIA REPAIR UMBILICAL ADULT;  Surgeon: Lemar Livings,  Merrily Pew, MD;  Location: [facility] ORS;  Service: General;  Laterality: N/A;  open umbilical hernia repair    Current Outpatient Medications  Medication Sig Dispense Refill   aspirin 81 MG tablet Take 81 mg by mouth at bedtime.     Ca Carbonate-Mag Hydroxide (ROLAIDS PO) Take 1-2 tablets by mouth daily as needed  (heartburn).     flecainide (TAMBOCOR) 50 MG tablet Take 1 tablet (50 mg total) by mouth 2 (two) times daily. 60 tablet 0   ibuprofen (ADVIL) 200 MG tablet Take 400-800 mg by mouth every 6 (six) hours as needed for headache or moderate pain.     Krill Oil 350 MG CAPS Take 350 mg by mouth daily.     meloxicam (MOBIC) 7.5 MG tablet Take 7.5 mg by mouth daily as needed.     metoprolol succinate (TOPROL-XL) 25 MG 24 hr tablet TAKE 1 TABLET BY MOUTH EVERY DAY WITH OR IMMEDIATELY FOLLOWING A MEAL 90 tablet 3   Potassium Citrate 15 MEQ (1620 MG) TBCR Take 1 tablet by mouth 2 (two) times daily.     simvastatin (ZOCOR) 80 MG tablet Take 40 mg by mouth daily.     tadalafil (CIALIS) 5 MG tablet Take by mouth as needed.     tamsulosin (FLOMAX) 0.4 MG CAPS capsule Take 0.4 mg by mouth daily.     metoprolol tartrate (LOPRESSOR) 25 MG tablet Take 1 tablet (25 mg total) by mouth 2 (two) times daily as needed. (Patient not taking: Reported on 06/12/2023) 60 tablet 3   No current facility-administered medications for this visit.    Allergies:   Patient has no known allergies.   Social History:  The patient  reports that he has never smoked. He has never used smokeless tobacco. He reports that he does not drink alcohol and does not use drugs.   Family History:   family history includes Heart attack (age of onset: 37) in his father; Hypertension in his mother.   Review of Systems: Review of Systems  Constitutional: Negative.   HENT: Negative.    Respiratory: Negative.    Cardiovascular: Negative.   Gastrointestinal: Negative.   Musculoskeletal: Negative.   Neurological: Negative.   Psychiatric/Behavioral: Negative.    All other systems reviewed and are negative.   PHYSICAL EXAM: VS:  BP 120/70 (BP Location: Left Arm, Patient Position: Sitting, Cuff Size: Normal)   Pulse (!) 57   Ht 6' (1.829 m)   Wt 245 lb 4 oz (111.2 kg)   SpO2 98%   BMI 33.26 kg/m  , BMI Body mass index is 33.26 kg/m.   Constitutional:  oriented to person, place, and time. No distress.  HENT:  Head: Grossly normal Eyes:  no discharge. No scleral icterus.  Neck: No JVD, no carotid bruits  Cardiovascular: Regular rate and rhythm, no murmurs appreciated Pulmonary/Chest: Clear to auscultation bilaterally, no wheezes or rails Abdominal: Soft.  no distension.  no tenderness.  Musculoskeletal: Normal range of motion Neurological:  normal muscle tone. Coordination normal. No atrophy Skin: Skin warm and dry Psychiatric: normal affect, pleasant   Recent Labs: 04/12/2023: BUN 17; Creatinine, Ser 0.78; Magnesium 1.9; Potassium 4.3; Sodium 143; TSH 2.260    Lipid Panel Lab Results  Component Value Date   CHOL 175 09/06/2013   HDL 29 (L) 09/06/2013   LDLCALC 117 (H) 09/06/2013   TRIG 145 09/06/2013    Wt Readings from Last 3 Encounters:  06/12/23 245 lb 4 oz (111.2 kg)  05/15/23 247 lb  2 oz (112.1 kg)  04/12/23 243 lb 9.6 oz (110.5 kg)     ASSESSMENT AND PLAN:  Atrial fibrillation, unspecified type (HCC) Reports he started wearing CPAP but difficulty tolerating Denies A-fib episodes Rare reported episodes of A-fib, using his Apple Watch to determine A-fib burden CHADS VASC 2 Talked about blood thinners, declining at this time Hx of hematuria, for increased fib burden, may need to consider watchman device  Hyperlipidemia Continue statin/simvastatin  Obstructive sleep apnea Followed by ENT, Difficulty tolerating CPAP  Diabetes type 2 A1C 7 We have encouraged continued exercise, careful diet management in an effort to lose weight.   Orders Placed This Encounter  Procedures   EKG 12-Lead     Signed, Dossie Arbour, M.D., Ph.D. 06/12/2023  [facility] [place], [place] [phone]

## 2023-06-12 ENCOUNTER — Ambulatory Visit: Payer: HMO | Attending: Cardiovascular Disease | Admitting: Cardiovascular Disease

## 2023-06-12 VITALS — BP 120/70 | HR 57 | Ht 72.0 in | Wt 245.2 lb

## 2023-06-12 DIAGNOSIS — E782 Mixed hyperlipidemia: Secondary | ICD-10-CM

## 2023-06-12 DIAGNOSIS — G4733 Obstructive sleep apnea (adult) (pediatric): Secondary | ICD-10-CM

## 2023-06-12 DIAGNOSIS — I480 Paroxysmal atrial fibrillation: Secondary | ICD-10-CM | POA: Diagnosis not present

## 2023-06-12 DIAGNOSIS — I10 Essential (primary) hypertension: Secondary | ICD-10-CM | POA: Diagnosis not present

## 2023-06-12 MED ORDER — FLECAINIDE ACETATE 50 MG PO TABS: 50.0000 mg | ORAL_TABLET | Freq: Two times a day (BID) | ORAL | 6 refills | Status: DC

## 2023-06-12 MED ORDER — METOPROLOL TARTRATE 25 MG PO TABS: 25.0000 mg | ORAL_TABLET | Freq: Two times a day (BID) | ORAL | 3 refills | Status: AC | PRN

## 2023-06-12 MED ORDER — METOPROLOL SUCCINATE ER 25 MG PO TB24: ORAL_TABLET | ORAL | 3 refills | Status: AC

## 2023-06-12 MED ORDER — SIMVASTATIN 80 MG PO TABS: 40.0000 mg | ORAL_TABLET | Freq: Every day | ORAL | 3 refills | Status: AC

## 2023-06-12 NOTE — Patient Instructions (Addendum)
Medication Instructions:  No changes  For afib spell, Take extra flecainide and metoprolol tartrate  If you need a refill on your cardiac medications before your next appointment, please call your pharmacy.   Lab work: No new labs needed  Testing/Procedures: No new testing needed  Follow-Up: At [facility], you and your health needs are our priority.  As part of our continuing mission to provide you with exceptional heart care, we have created designated Provider Care Teams.  These Care Teams include your primary Cardiologist (physician) and Advanced Practice Providers (APPs -  Physician Assistants and Nurse Practitioners) who all work together to provide you with the care you need, when you need it.  You will need a follow up appointment in 12 months  Providers on your designated Care Team:   Nicolasa Ducking, NP Eula Listen, PA-C Cadence Fransico Michael, [place]  COVID-19 Vaccine Information can be found at: [email or web address] For questions related to vaccine distribution or appointments, please email [email or web address] or call [phone].

## 2023-06-13 DIAGNOSIS — L820 Inflamed seborrheic keratosis: Secondary | ICD-10-CM | POA: Diagnosis not present

## 2023-06-13 DIAGNOSIS — D2271 Melanocytic nevi of right lower limb, including hip: Secondary | ICD-10-CM | POA: Diagnosis not present

## 2023-06-13 DIAGNOSIS — D2262 Melanocytic nevi of left upper limb, including shoulder: Secondary | ICD-10-CM | POA: Diagnosis not present

## 2023-06-13 DIAGNOSIS — R208 Other disturbances of skin sensation: Secondary | ICD-10-CM | POA: Diagnosis not present

## 2023-06-13 DIAGNOSIS — D225 Melanocytic nevi of trunk: Secondary | ICD-10-CM | POA: Diagnosis not present

## 2023-06-13 DIAGNOSIS — L821 Other seborrheic keratosis: Secondary | ICD-10-CM | POA: Diagnosis not present

## 2023-06-13 DIAGNOSIS — D485 Neoplasm of uncertain behavior of skin: Secondary | ICD-10-CM | POA: Diagnosis not present

## 2023-06-13 DIAGNOSIS — D2272 Melanocytic nevi of left lower limb, including hip: Secondary | ICD-10-CM | POA: Diagnosis not present

## 2023-06-13 DIAGNOSIS — D2261 Melanocytic nevi of right upper limb, including shoulder: Secondary | ICD-10-CM | POA: Diagnosis not present

## 2023-09-11 DIAGNOSIS — G4733 Obstructive sleep apnea (adult) (pediatric): Secondary | ICD-10-CM | POA: Diagnosis not present

## 2023-09-11 DIAGNOSIS — R070 Pain in throat: Secondary | ICD-10-CM | POA: Diagnosis not present

## 2023-09-13 DIAGNOSIS — J029 Acute pharyngitis, unspecified: Secondary | ICD-10-CM | POA: Diagnosis not present

## 2023-09-18 DIAGNOSIS — E7800 Pure hypercholesterolemia, unspecified: Secondary | ICD-10-CM | POA: Diagnosis not present

## 2023-09-18 DIAGNOSIS — I480 Paroxysmal atrial fibrillation: Secondary | ICD-10-CM | POA: Diagnosis not present

## 2023-09-18 DIAGNOSIS — E1129 Type 2 diabetes mellitus with other diabetic kidney complication: Secondary | ICD-10-CM | POA: Diagnosis not present

## 2023-09-18 DIAGNOSIS — G4733 Obstructive sleep apnea (adult) (pediatric): Secondary | ICD-10-CM | POA: Diagnosis not present

## 2023-09-18 DIAGNOSIS — N529 Male erectile dysfunction, unspecified: Secondary | ICD-10-CM | POA: Diagnosis not present

## 2023-09-18 DIAGNOSIS — M25511 Pain in right shoulder: Secondary | ICD-10-CM | POA: Diagnosis not present

## 2023-09-18 DIAGNOSIS — G8929 Other chronic pain: Secondary | ICD-10-CM | POA: Diagnosis not present

## 2023-09-18 DIAGNOSIS — Z794 Long term (current) use of insulin: Secondary | ICD-10-CM | POA: Diagnosis not present

## 2023-09-18 DIAGNOSIS — R809 Proteinuria, unspecified: Secondary | ICD-10-CM | POA: Diagnosis not present

## 2023-09-18 DIAGNOSIS — Z0000 Encounter for general adult medical examination without abnormal findings: Secondary | ICD-10-CM | POA: Diagnosis not present

## 2023-09-18 DIAGNOSIS — N200 Calculus of kidney: Secondary | ICD-10-CM | POA: Diagnosis not present

## 2023-10-09 DIAGNOSIS — G4733 Obstructive sleep apnea (adult) (pediatric): Secondary | ICD-10-CM | POA: Diagnosis not present

## 2023-10-09 DIAGNOSIS — R682 Dry mouth, unspecified: Secondary | ICD-10-CM | POA: Diagnosis not present

## 2023-10-12 DIAGNOSIS — I480 Paroxysmal atrial fibrillation: Secondary | ICD-10-CM | POA: Diagnosis not present

## 2023-10-12 DIAGNOSIS — E7800 Pure hypercholesterolemia, unspecified: Secondary | ICD-10-CM | POA: Diagnosis not present

## 2023-10-12 DIAGNOSIS — R809 Proteinuria, unspecified: Secondary | ICD-10-CM | POA: Diagnosis not present

## 2023-10-12 DIAGNOSIS — E1129 Type 2 diabetes mellitus with other diabetic kidney complication: Secondary | ICD-10-CM | POA: Diagnosis not present

## 2023-10-17 DIAGNOSIS — N200 Calculus of kidney: Secondary | ICD-10-CM | POA: Diagnosis not present

## 2023-11-13 DIAGNOSIS — R809 Proteinuria, unspecified: Secondary | ICD-10-CM | POA: Diagnosis not present

## 2023-11-13 DIAGNOSIS — Z6832 Body mass index (BMI) 32.0-32.9, adult: Secondary | ICD-10-CM | POA: Diagnosis not present

## 2023-11-13 DIAGNOSIS — E66811 Obesity, class 1: Secondary | ICD-10-CM | POA: Diagnosis not present

## 2023-11-13 DIAGNOSIS — E1129 Type 2 diabetes mellitus with other diabetic kidney complication: Secondary | ICD-10-CM | POA: Diagnosis not present

## 2023-11-13 DIAGNOSIS — E785 Hyperlipidemia, unspecified: Secondary | ICD-10-CM | POA: Diagnosis not present

## 2023-11-23 DIAGNOSIS — E1129 Type 2 diabetes mellitus with other diabetic kidney complication: Secondary | ICD-10-CM | POA: Diagnosis not present

## 2023-11-23 DIAGNOSIS — R809 Proteinuria, unspecified: Secondary | ICD-10-CM | POA: Diagnosis not present

## 2023-11-23 DIAGNOSIS — E7800 Pure hypercholesterolemia, unspecified: Secondary | ICD-10-CM | POA: Diagnosis not present

## 2023-11-23 DIAGNOSIS — I480 Paroxysmal atrial fibrillation: Secondary | ICD-10-CM | POA: Diagnosis not present

## 2023-12-03 DIAGNOSIS — Z87442 Personal history of urinary calculi: Secondary | ICD-10-CM | POA: Diagnosis not present

## 2023-12-03 DIAGNOSIS — R310 Gross hematuria: Secondary | ICD-10-CM | POA: Diagnosis not present

## 2023-12-03 DIAGNOSIS — N200 Calculus of kidney: Secondary | ICD-10-CM | POA: Diagnosis not present

## 2023-12-11 DIAGNOSIS — H04123 Dry eye syndrome of bilateral lacrimal glands: Secondary | ICD-10-CM | POA: Diagnosis not present

## 2024-01-01 DIAGNOSIS — R310 Gross hematuria: Secondary | ICD-10-CM | POA: Diagnosis not present

## 2024-01-01 DIAGNOSIS — N132 Hydronephrosis with renal and ureteral calculous obstruction: Secondary | ICD-10-CM | POA: Diagnosis not present

## 2024-01-01 DIAGNOSIS — N200 Calculus of kidney: Secondary | ICD-10-CM | POA: Diagnosis not present

## 2024-01-01 DIAGNOSIS — K5730 Diverticulosis of large intestine without perforation or abscess without bleeding: Secondary | ICD-10-CM | POA: Diagnosis not present

## 2024-01-01 DIAGNOSIS — R162 Hepatomegaly with splenomegaly, not elsewhere classified: Secondary | ICD-10-CM | POA: Diagnosis not present

## 2024-01-01 DIAGNOSIS — N134 Hydroureter: Secondary | ICD-10-CM | POA: Diagnosis not present

## 2024-01-15 ENCOUNTER — Other Ambulatory Visit: Payer: Self-pay

## 2024-01-15 ENCOUNTER — Ambulatory Visit: Admitting: Urology

## 2024-01-15 ENCOUNTER — Other Ambulatory Visit
Admission: RE | Admit: 2024-01-15 | Discharge: 2024-01-15 | Disposition: A | Discharge status: Home / Self Care | Attending: Urology | Admitting: Urology

## 2024-01-15 ENCOUNTER — Telehealth: Payer: Self-pay

## 2024-01-15 VITALS — BP 128/73 | HR 79 | Wt 239.0 lb

## 2024-01-15 DIAGNOSIS — N3281 Overactive bladder: Secondary | ICD-10-CM | POA: Diagnosis not present

## 2024-01-15 DIAGNOSIS — N201 Calculus of ureter: Secondary | ICD-10-CM

## 2024-01-15 DIAGNOSIS — Z125 Encounter for screening for malignant neoplasm of prostate: Secondary | ICD-10-CM | POA: Diagnosis not present

## 2024-01-15 LAB — URINALYSIS, COMPLETE (UACMP) WITH MICROSCOPIC
Bilirubin Urine: NEGATIVE
Glucose, UA: >=500 mg/dL — AB
Leukocytes,Ua: NEGATIVE
Nitrite: NEGATIVE
Protein, ur: 30 mg/dL — AB
RBC / HPF: >50 RBC/hpf (ref 0–5)
Specific Gravity, Urine: 1.02 (ref 1.005–1.030)
pH: 5.5 (ref 5.0–8.0)

## 2024-01-15 NOTE — Telephone Encounter (Signed)
   Name: Marcus Bryant  DOB: [birth year]  MRN: [id number]  Primary Cardiologist: Evalene Lunger, MD   Preoperative team, please contact this patient and set up a phone call appointment for further preoperative risk assessment. Please obtain consent and complete medication review. Thank you for your help.   I confirmed the patient resides in the state of [place] . As per [facility] Medical Board telemedicine laws, the patient must reside in the state in which the provider is licensed.   Johnston Maddocks E Rebekah Sprinkle, PA-C 01/15/2024, 4:51 PM [facility]

## 2024-01-15 NOTE — Addendum Note (Signed)
 Addended by: SHERRE MONTIE DEL on: 01/15/2024 09:36 AM   Modules accepted: Orders

## 2024-01-15 NOTE — Telephone Encounter (Signed)
   Pre-operative Risk Assessment    Patient Name: Marcus Bryant  DOB: [birth year] MRN: [id number]   Date of last office visit: 06/22/23 Date of next office visit: not scheduled   Request for Surgical Clearance    Procedure:  Left uteroscopy with laser lithotripsy and stent replacement  Date of Surgery:  Clearance 01/21/24                                Surgeon:  Dr. Redell Burnet Surgeon's Group or Practice Name:  [facility] Phone number:  [phone] Fax number:  [phone]   Type of Clearance Requested:   - Medical    Type of Anesthesia:  General    Additional requests/questions:  States patient may remain on Aspirin, no hold neede  Bonney Ival LOISE Gerome   01/15/2024, 4:37 PM

## 2024-01-15 NOTE — Progress Notes (Signed)
 01/15/24 8:47 AM   Elsie ORN Whitmore 17-May-1955 [id number]  CC: Left ureteral stones, recurrent nephrolithiasis, PSA screening, history of gross hematuria, OAB, ED  HPI: 68 year old male with extensive history of recurrent stone disease, previously followed by Dr. Penne, [facility] urology, and Dr. Shane in [place].  Numerous prior ureteroscopy and shockwave lithotripsy procedures, 15+.  Stone type reportedly has been uric acid and calcium oxalate.  Most recently seen by Dr. Shane 12/03/2023 and was having some left-sided flank pain.  [facility] showed adjacent stones in the distal left ureter measuring 5 mm and 4 mm with severe left hydronephrosis, multiple nonobstructing bilateral renal calculi and enlarged prostate.  Those images are unavailable to personally review.  He denies any fevers or chills.  He has fairly severe overactive symptoms with urgency and urge incontinence.  Does not sound like he has tried OAB medications previously.  Has used Flomax  in the past but has very bothersome dry mouth.  ED has been refractory to 20 mg Cialis, not his priority at this time.  Most recent PSA stable at 3.87 from November 2024   PMH: Past Medical History:  Diagnosis Date   Arthritis    Chest pain    a. 08/2013 in the setting of atrial fibrillation-->MV EF 70%, no ischemia or infarct.   Complication of anesthesia    PONV   COVID-19 2022   Diastolic dysfunction    a. 10/2019 Echo: EF 60-65%, mild LVH, grade II diastolic dysfunction.  Normal RV function.  Moderately dilated LA.   History of kidney stones    Hyperlipidemia    Kidney stones    Morbid obesity (HCC)    Paroxysmal A-fib (HCC)    a. Dx 08/2013 - not previously on OAC; b. CHA2DS2VASc = 1; c. 09/2019 Flecainide  100mg  prn added.   Sleep apnea     Surgical History: Past Surgical History:  Procedure Laterality Date   COLONOSCOPY     EXTRACORPOREAL SHOCK WAVE LITHOTRIPSY Left 08/19/2020    Procedure: EXTRACORPOREAL SHOCK WAVE LITHOTRIPSY (ESWL);  Surgeon: Kassie Ozell SAUNDERS, MD;  Location: [facility] ORS;  Service: Urology;  Laterality: Left;   EXTRACORPOREAL SHOCK WAVE LITHOTRIPSY Left 09/15/2021   Procedure: EXTRACORPOREAL SHOCK WAVE LITHOTRIPSY (ESWL);  Surgeon: Kassie Ozell SAUNDERS, MD;  Location: [facility] ORS;  Service: Urology;  Laterality: Left;   LITHOTRIPSY     x 8   UMBILICAL HERNIA REPAIR N/A 11/10/2020   Procedure: HERNIA REPAIR UMBILICAL ADULT;  Surgeon: [name], MD;  Location: [facility] ORS;  Service: General;  Laterality: N/A;  open umbilical hernia repair    Family History: Family History  Problem Relation Age of Onset   Hypertension Mother    Heart attack Father 24    Social History:  reports that he has never smoked. He has never used smokeless tobacco. He reports that he does not drink alcohol and does not use drugs.  Physical Exam: BP 128/73 (BP Location: Left Arm, Patient Position: Sitting, Cuff Size: Normal)   Pulse 79   Wt 239 lb (108.4 kg)   SpO2 94%   BMI 32.41 kg/m    Constitutional:  Alert and oriented, No acute distress. Cardiovascular: No clubbing, cyanosis, or edema. Respiratory: Normal respiratory effort, no increased work of breathing. 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  Stone type reportedly uric acid and calcium oxalate previously, no recent 24-hour urine to review.  Currently with 5 mm and 4 mm left distal ureteral stones on recent CT, he has not seen stones pass.  Extensive review of prior outside records from  [facility] urology and Dr. Shane.  Nephrolithiasis: Recurrent.  Currently with 2 left distal ureteral stones and severe left hydronephrosis, he desires treatment.  We discussed options between shockwave lithotripsy or ureteroscopy, and I recommended ureteroscopy with his multiple stones, will also plan to manage reported mild left renal stone burden. We specifically discussed the risks ureteroscopy including bleeding, infection/sepsis, stent related symptoms including flank pain/urgency/frequency/incontinence/dysuria, ureteral injury, ureteral stricture, inability to access stone, or need for staged or additional procedures. Stone prevention: Will need 24-hour urine workup after surgery, will send stone for analysis.  He has not tolerated potassium citrate, will consider adding baking soda and lemon juice pending 24-hour urine results. OAB: May be related to distal ureteral stones, consider a beta 3 agonist at follow-up.  Will check PVR at follow-up to confirm appropriate emptying.  Did not tolerate Flomax  secondary to dry mouth. ED: Did not respond to 20 mg Cialis on demand, not a priority at this time PSA screening: Reassurance provided regarding normal PSA  Redell Burnet, MD 01/15/2024  [facility] [address], [address] [place], [place] [address] [phone]

## 2024-01-15 NOTE — Progress Notes (Signed)
 Surgical Physician Order [facility] [place]  * Scheduling expectation : Monday 9/29  *Length of Case: 1.5 hours  *Clearance needed: no  *Anticoagulation Instructions: May continue all anticoagulants  *Aspirin Instructions: Ok to continue all  *Post-op visit Date/Instructions: Stent removal 1 to 2 weeks  *Diagnosis: Left Ureteral Stone  *Procedure: left Ureteroscopy w/laser lithotripsy & stent placement (47643)   Additional orders: N/A  -Admit type: OUTpatient  -Anesthesia: General  -VTE Prophylaxis Standing Order SCD's       Other:   -Standing Lab Orders Per Anesthesia    Lab other: UA&Urine Culture sent 9/23  -Standing Test orders EKG/Chest x-ray per Anesthesia       Test other:   - Medications:  Ancef  2gm IV  -Other orders:  N/A

## 2024-01-15 NOTE — Progress Notes (Signed)
  Phone Number: [phone] for Surgical Coordinator Fax Number: [phone]  REQUEST FOR SURGICAL CLEARANCE      Date: Date: 01/15/24  [facility]ed to: [facility]  Surgeon: Dr. Redell Burnet, MD     Date of Surgery: 01/21/2024  Operation: Left Ureteroscopy with Laser Lithotripsy and Stent Placement   Anesthesia Type: General   Diagnosis: Left Ureteral Stone  Patient Requires:   Cardiac / Vascular Clearance : Yes  Reason: Patient may remain on Aspirin, No holds  Risk Assessment:    Low   []       Moderate   []     High   []           This patient is optimized for surgery  YES []       NO   []    I recommend further assessment/workup prior to surgery. YES []      NO  []   Appointment scheduled for: _______________________   Further recommendations: ____________________________________     Physician Signature:__________________________________   Printed Name: ________________________________________   Date: _________________

## 2024-01-15 NOTE — Patient Instructions (Signed)
 SABRA

## 2024-01-15 NOTE — Progress Notes (Signed)
   [facility] Surgical Posting Form  Surgery Date: Date: 01/21/2024  Surgeon: Dr. Redell Burnet, MD  Inpt ( No  )   Outpt (Yes)   Obs ( No  )   Diagnosis: N20.1 Left Ureteral Stone  -CPT: [phone]  Surgery: Left Ureteroscopy with Laser Lithotripsy and Stent Placement  Stop Anticoagulations: No, may continue all  Cardiac/Medical/Pulmonary Clearance needed: no  Clearance needed from Dr: Chaney  Clearance request sent on: Date: 01/15/24  *Orders entered into EPIC  Date: 01/15/24   *Case booked in EPIC  Date: 01/15/24  *Notified pt of Surgery: Date: 01/15/24  PRE-OP UA & CX: Yes, obtained in clinic today  *Placed into Prior Authorization Work Que Date: 01/15/24  Assistant/laser/rep:No

## 2024-01-15 NOTE — Telephone Encounter (Signed)
 Left message for pt to call back and schedule tele preop appt this week.

## 2024-01-15 NOTE — Telephone Encounter (Signed)
 Per [name], Patient is to be scheduled for Left Ureteroscopy with Laser Lithotripsy and Stent Placement   Mr. Ellsworth  was contacted and possible surgical dates were discussed, Monday September 29th, 2025 was agreed upon for surgery.   Patient was directed to call [phone] between 1-3pm the day before surgery to find out surgical arrival time.  Instructions were given not to eat or drink from midnight on the night before surgery and have a driver for the day of surgery. On the surgery day patient was instructed to enter through the Medical Mall entrance of [facility] report the Same Day Surgery desk.   Pre-Admit Testing will be in contact via phone to set up an interview with the anesthesia team to review your history and medications prior to surgery.   Reminder of this information was sent via MyChart to the patient.

## 2024-01-16 ENCOUNTER — Encounter: Payer: Self-pay | Admitting: Urgent Care

## 2024-01-16 ENCOUNTER — Telehealth: Payer: Self-pay | Admitting: *Deleted

## 2024-01-16 LAB — URINE CULTURE: Culture: NO GROWTH

## 2024-01-16 NOTE — Telephone Encounter (Signed)
 Pt was returning Carol's call and is requesting a callback. Please advise

## 2024-01-16 NOTE — Telephone Encounter (Signed)
 Pt has been scheduled tele preop appt 01/18/24. Med rec and consent are done.     Patient Consent for Virtual Visit        Marcus Bryant has provided verbal consent on 01/16/2024 for a virtual visit (video or telephone).   CONSENT FOR VIRTUAL VISIT FOR:  Marcus Bryant  By participating in this virtual visit I agree to the following:  I hereby voluntarily request, consent and authorize [facility] and its employed or contracted physicians, physician assistants, nurse practitioners or other licensed health care professionals (the Practitioner), to provide me with telemedicine health care services (the "Services) as deemed necessary by the treating Practitioner. I acknowledge and consent to receive the Services by the Practitioner via telemedicine. I understand that the telemedicine visit will involve communicating with the Practitioner through live audiovisual communication technology and the disclosure of certain medical information by electronic transmission. I acknowledge that I have been given the opportunity to request an in-person assessment or other available alternative prior to the telemedicine visit and am voluntarily participating in the telemedicine visit.  I understand that I have the right to withhold or withdraw my consent to the use of telemedicine in the course of my care at any time, without affecting my right to future care or treatment, and that the Practitioner or I may terminate the telemedicine visit at any time. I understand that I have the right to inspect all information obtained and/or recorded in the course of the telemedicine visit and may receive copies of available information for a reasonable fee.  I understand that some of the potential risks of receiving the Services via telemedicine include:  Delay or interruption in medical evaluation due to technological equipment failure or disruption; Information transmitted may not be sufficient (e.g. poor  resolution of images) to allow for appropriate medical decision making by the Practitioner; and/or  In rare instances, security protocols could fail, causing a breach of personal health information.  Furthermore, I acknowledge that it is my responsibility to provide information about my medical history, conditions and care that is complete and accurate to the best of my ability. I acknowledge that Practitioner's advice, recommendations, and/or decision may be based on factors not within their control, such as incomplete or inaccurate data provided by me or distortions of diagnostic images or specimens that may result from electronic transmissions. I understand that the practice of medicine is not an exact science and that Practitioner makes no warranties or guarantees regarding treatment outcomes. I acknowledge that a copy of this consent can be made available to me via my patient portal [facility] MyChart), or I can request a printed copy by calling the office of [facility].    I understand that my insurance will be billed for this visit.   I have read or had this consent read to me. I understand the contents of this consent, which adequately explains the benefits and risks of the Services being provided via telemedicine.  I have been provided ample opportunity to ask questions regarding this consent and the Services and have had my questions answered to my satisfaction. I give my informed consent for the services to be provided through the use of telemedicine in my medical care

## 2024-01-16 NOTE — Telephone Encounter (Signed)
 Pt has been scheduled tele preop appt 01/18/24. Med rec and consent are done.

## 2024-01-17 ENCOUNTER — Encounter: Payer: Self-pay | Admitting: Urgent Care

## 2024-01-17 ENCOUNTER — Inpatient Hospital Stay: Admission: RE | Admit: 2024-01-17 | Source: Ambulatory Visit

## 2024-01-18 ENCOUNTER — Other Ambulatory Visit: Payer: Self-pay

## 2024-01-18 ENCOUNTER — Ambulatory Visit: Attending: Cardiovascular Disease | Admitting: Emergency Medicine

## 2024-01-18 ENCOUNTER — Encounter
Admission: RE | Admit: 2024-01-18 | Discharge: 2024-01-18 | Disposition: A | Discharge status: Home / Self Care | Source: Ambulatory Visit | Attending: Urology | Admitting: Urology

## 2024-01-18 ENCOUNTER — Other Ambulatory Visit: Payer: Self-pay | Admitting: Urology

## 2024-01-18 ENCOUNTER — Encounter: Payer: Self-pay | Admitting: Urology

## 2024-01-18 ENCOUNTER — Other Ambulatory Visit
Admission: RE | Admit: 2024-01-18 | Discharge: 2024-01-18 | Disposition: A | Discharge status: Home / Self Care | Source: Home / Self Care | Attending: Urology | Admitting: Urology

## 2024-01-18 ENCOUNTER — Ambulatory Visit
Admission: RE | Admit: 2024-01-18 | Discharge: 2024-01-18 | Disposition: A | Discharge status: Home / Self Care | Source: Ambulatory Visit | Attending: Urology | Admitting: Urology

## 2024-01-18 ENCOUNTER — Encounter: Payer: Self-pay | Admitting: Urgent Care

## 2024-01-18 ENCOUNTER — Ambulatory Visit: Payer: Self-pay | Admitting: Urgent Care

## 2024-01-18 VITALS — Ht 72.0 in | Wt 243.0 lb

## 2024-01-18 DIAGNOSIS — R310 Gross hematuria: Secondary | ICD-10-CM | POA: Insufficient documentation

## 2024-01-18 DIAGNOSIS — N200 Calculus of kidney: Secondary | ICD-10-CM | POA: Insufficient documentation

## 2024-01-18 DIAGNOSIS — I4891 Unspecified atrial fibrillation: Secondary | ICD-10-CM | POA: Insufficient documentation

## 2024-01-18 DIAGNOSIS — N201 Calculus of ureter: Secondary | ICD-10-CM

## 2024-01-18 DIAGNOSIS — R079 Chest pain, unspecified: Secondary | ICD-10-CM | POA: Diagnosis not present

## 2024-01-18 DIAGNOSIS — Z01810 Encounter for preprocedural cardiovascular examination: Secondary | ICD-10-CM

## 2024-01-18 DIAGNOSIS — Z01818 Encounter for other preprocedural examination: Secondary | ICD-10-CM | POA: Insufficient documentation

## 2024-01-18 DIAGNOSIS — Z01812 Encounter for preprocedural laboratory examination: Secondary | ICD-10-CM | POA: Insufficient documentation

## 2024-01-18 DIAGNOSIS — Z79899 Other long term (current) drug therapy: Secondary | ICD-10-CM | POA: Diagnosis not present

## 2024-01-18 DIAGNOSIS — E785 Hyperlipidemia, unspecified: Secondary | ICD-10-CM

## 2024-01-18 HISTORY — DX: Long term (current) use of aspirin: Z79.82

## 2024-01-18 HISTORY — DX: Obstructive sleep apnea (adult) (pediatric): G47.33

## 2024-01-18 HISTORY — DX: Essential (primary) hypertension: I10

## 2024-01-18 HISTORY — DX: Calculus of ureter: N20.1

## 2024-01-18 LAB — CBC
HCT: 40.5 % (ref 39.0–52.0)
Hemoglobin: 14.4 g/dL (ref 13.0–17.0)
MCH: 32.2 pg (ref 26.0–34.0)
MCHC: 35.6 g/dL (ref 30.0–36.0)
MCV: 90.6 fL (ref 80.0–100.0)
Platelets: 153 K/uL (ref 150–400)
RBC: 4.47 MIL/uL (ref 4.22–5.81)
RDW: 12.3 % (ref 11.5–15.5)
WBC: 5.9 K/uL (ref 4.0–10.5)
nRBC: 0 % (ref 0.0–0.2)

## 2024-01-18 LAB — BASIC METABOLIC PANEL WITH GFR
Anion gap: 13 (ref 5–15)
BUN: 12 mg/dL (ref 8–23)
CO2: 25 mmol/L (ref 22–32)
Calcium: 8.9 mg/dL (ref 8.9–10.3)
Chloride: 101 mmol/L (ref 98–111)
Creatinine, Ser: 0.56 mg/dL — ABNORMAL LOW (ref 0.61–1.24)
GFR, Estimated: >60 mL/min (ref >60)
Glucose, Bld: 278 mg/dL — ABNORMAL HIGH (ref 70–99)
Potassium: 4.2 mmol/L (ref 3.5–5.1)
Sodium: 139 mmol/L (ref 135–145)

## 2024-01-18 LAB — HEMOGLOBIN A1C
Hgb A1c MFr Bld: 8.2 % — ABNORMAL HIGH (ref 4.8–5.6)
Mean Plasma Glucose: 188.64 mg/dL

## 2024-01-18 NOTE — Patient Instructions (Addendum)
 Your procedure is scheduled on:   MONDAY SEPTEMBER 29  Report to the Registration Desk on the 1st floor of the [facility] To find out your arrival time, please call [phone] between 1PM - 3PM on:  FRIDAY SEPTEMBER 26 If your arrival time is 6:00 am, do not arrive before that time as the Medical Mall entrance doors do not open until 6:00 am. YOU ARE TO ARRIVE at 8:45  REMEMBER: Instructions that are not followed completely may result in serious medical risk, up to and including death; or upon the discretion of your surgeon and anesthesiologist your surgery may need to be rescheduled.  Do not eat food after midnight the night before surgery.  No gum chewing or hard candies.   One week prior to surgery:   Stop Anti-inflammatories (NSAIDS) such as Advil, Aleve, Ibuprofen, Motrin, Naproxen, Naprosyn and Aspirin based products such as Excedrin, Goody's Powder, BC Powder. Stop ANY OVER THE COUNTER supplements until after surgery. ASHWAGANDHA  Ca Carbonate-Mag Hydroxide (ROLAIDS ) Krill Oil  Nitric Oxide  Turmeric (QC TUMERIC COMPLEX)   You may however, continue to take Tylenol  if needed for pain up until the day of surgery.   Continue taking all of your other prescription medications up until the day of surgery.  ON THE DAY OF SURGERY ONLY TAKE THESE MEDICATIONS WITH SIPS OF WATER:  flecainide  (TAMBOCOR )  metoprolol  succinate (TOPROL -XL)    No Alcohol for 24 hours before or after surgery.  Do not use any recreational drugs for at least a week (preferably 2 weeks) before your surgery.  Please be advised that the combination of cocaine and anesthesia may have negative outcomes, up to and including death. If you test positive for cocaine, your surgery will be cancelled.  On the morning of surgery brush your teeth with toothpaste and water, you may rinse your mouth with mouthwash if you wish. Do not swallow any toothpaste or mouthwash.  Do not wear jewelry, make-up, hairpins,  clips or nail polish.  For welded (permanent) jewelry: bracelets, anklets, waist bands, etc.  Please have this removed prior to surgery.  If it is not removed, there is a chance that hospital personnel will need to cut it off on the day of surgery.  Do not wear lotions, powders, or perfumes.   Do not shave body hair from the neck down 48 hours before surgery.  Contact lenses, hearing aids and dentures may not be worn into surgery.  Do not bring valuables to the hospital. [facility] is not responsible for any missing/lost belongings or valuables.   Notify your doctor if there is any change in your medical condition (cold, fever, infection).  Wear comfortable clothing (specific to your surgery type) to the hospital.  After surgery, you can help prevent lung complications by doing breathing exercises.  Take deep breaths and cough every 1-2 hours.   If you are being discharged the day of surgery, you will not be allowed to drive home. You will need a responsible individual to drive you home and stay with you for 24 hours after surgery.   If you are taking public transportation, you will need to have a responsible individual with you.  Please call the Pre-admissions Testing Dept. at [phone] if you have any questions about these instructions.  Surgery Visitation Policy:  Patients having surgery or a procedure may have two visitors.  Children under the age of 2 must have an adult with them who is not the patient.  Merchandiser, retail to address health-related social needs:  [email or web address]

## 2024-01-18 NOTE — Progress Notes (Signed)
 Perioperative / Anesthesia Services  Pre-Admission Testing Clinical Review / Pre-Operative Anesthesia Consult  Date: 01/18/24  PATIENT DEMOGRAPHICS: Name: Marcus Bryant DOB: 1955-09-16 MRN:   [id number]  Note: Available PAT nursing documentation and vital signs have been reviewed. Clinical nursing staff has updated patient's PMH/PSHx, current medication list, and drug allergies/intolerances to ensure complete and comprehensive history available to assist care teams in MDM as it pertains to the aforementioned surgical procedure and anticipated anesthetic course. Extensive review of available clinical information personally performed. [place] [facility] and PSHx updated with any diagnoses/procedures that  may have been inadvertently omitted during his intake with the pre-admission testing department's nursing staff.  PLANNED SURGICAL PROCEDURE(S):   Case: [id number] Date/Time: 01/21/24 1032   Procedure: CYSTOSCOPY/URETEROSCOPY/HOLMIUM LASER/STENT PLACEMENT (Left)   Anesthesia type: General   Diagnosis: Left ureteral stone [N20.1]   Pre-op diagnosis: Left Ureteral Stone   Location: [facility] OR ROOM 10 / [facility]S [facility]   Surgeons: Francisca Redell BROCKS, MD        CLINICAL DISCUSSION: Marcus Bryant is a 68 y.o. male who is submitted for pre-surgical anesthesia review and clearance prior to him undergoing the above procedure. Patient has never been a smoker in the past. Pertinent PMH includes: PAF, diastolic dysfunction, angina, HTN, HLD, T2DM, OSAH (requires nocturnal PAP therapy), OA, nephrolithiasis.  Patient is followed by cardiology (Gollan, MD). He was last seen in the cardiology clinic on 06/12/2023; notes reviewed. At the time of his clinic visit, patient doing well overall from a cardiovascular perspective. Patient denied any chest pain, shortness of breath, PND, orthopnea, palpitations, significant peripheral edema, weakness, fatigue, vertiginous symptoms, or  presyncope/syncope. Patient with a past medical history significant for cardiovascular diagnoses. Documented physical exam was grossly benign, providing no evidence of acute exacerbation and/or decompensation of the patient's known cardiovascular conditions.  Most recent TTE performed on 11/07/2019 revealed a normal left ventricular systolic function with an EF of 60-65%. There was mild LVH.  There were no regional wall motion abnormalities.  Left atrium moderately dilated.  Left ventricular diastolic Doppler parameters consistent with pseudonormalization (G2DD). Right ventricular size and function normal with a TAPSE measuring 2.9 cm  (normal range >/= 1.6 cm). There was pulmonary valve regurgitation.  All transvalvular gradients were noted to be normal providing no evidence of hemodynamically significant valvular stenosis. Aorta normal in size with no evidence of ectasia or aneurysmal dilatation.  Most recent myocardial perfusion imaging study was performed on 03/22/2021 revealing a normal left ventricular systolic function with an EF of 65%.  There were no regional wall motion abnormalities.  No artifact or left ventricular cavity size enlargement appreciated on review of imaging. SPECT images demonstrated small in size, mild in severity, fixed apical lateral and apical inferior defect that most likely represents artifact, however scar cannot be excluded. TID ratio = 0.66 (normal range </= 1.2). Study determined to be normal and low risk.  Patient with an atrial fibrillation diagnosis; CHA2DS2-VASc Score = 3 (age, HTN, T2DM). His rate and rhythm are currently being maintained on oral flecainide  + metoprolol .  Despite encouragement from cardiology providers, patient refuses oral anticoagulation therapy due to past episodes of hematuria.  Cardiology noted that if A-fib burden increased in the future, LAA ligation (Watchman) could be considered. Blood pressure well controlled at 120/70 mmHg on currently  prescribed beta-blocker (metoprolol  succinate) monotherapy.  Patient is on simvastatin  for his HLD diagnosis and ASCVD prevention.  T2DM poorly controlled on currently prescribed regimen; last hemoglobin A1c was  9.6% when checked on 09/18/2023.  Of note, hemoglobin A1c was rechecked in PAT on 01/18/2024 with some improvement down to 8.2%.  Patient does have an OSAH diagnosis and is reported be compliant with prescribed nocturnal PAP therapy. Patient is able to complete all of his  ADL/IADLs without cardiovascular limitation.  Per the DASI, patient is able to achieve at least 4 METS of physical activity without experiencing any significant degree of angina/anginal equivalent symptoms. No changes were made to his medication regimen during his visit with cardiology.  Patient scheduled to follow-up with outpatient cardiology in 12 months or sooner if needed.  Elsie ORN Boehle is scheduled for an elective CYSTOSCOPY/URETEROSCOPY/HOLMIUM LASER/STENT PLACEMENT (Left) on 01/21/2024 with Dr. Redell JAYSON Burnet, MD. Given patient's past medical history significant for cardiovascular diagnoses, presurgical cardiac clearance was sought by the PAT team. Per cardiology, according to the Revised Cardiac Risk Index (RCRI), his Perioperative Risk of Major Cardiac Event is (%): 0.4. His Functional Capacity in METs is: 5.07 according to the Duke Activity Status Index (DASI). Therefore, based on ACC/AHA guidelines, patient would be at ACCEPTABLE risk for the planned procedure without further cardiovascular testing.  In review of the patient's chart, it is noted that he is on daily oral antithrombotic therapy. Given that patient's past medical history is significant for cardiovascular diagnoses, urology has cleared patient to continue his daily low dose ASA throughout his perioperative course.  Patient has been updated on these directives from his specialty care providers by the PAT team.  Patient reports previous perioperative  complications with anesthesia in the past. Patient has a PMH (+) for PONV. Symptoms and history of PONV will be discussed with patient by anesthesia team on the day of her procedure. Interventions will be ordered as deemed necessary based on patient's individual care needs as determined by anesthesiologist.  In review his EMR, it is noted that patient underwent a general anesthetic course here at [facility] ([facility] III) in 10/2020 without documented complications.   MOST RECENT VITAL SIGNS:    01/18/2024   12:51 PM 01/15/2024    8:50 AM 06/12/2023    8:43 AM  Vitals with BMI  Height 6' 0  6' 0  Weight 243 lbs 239 lbs 245 lbs 4 oz  BMI 32.95  33.25  Systolic  128 120  Diastolic  73 70  Pulse  79 57   PROVIDERS/SPECIALISTS: NOTE: Primary physician provider listed below. Patient may have been seen by APP or partner within same practice.   PROVIDER ROLE / SPECIALTY LAST SHERLEAN Burnet Redell JAYSON, MD Urology (Surgeon) 01/15/2024  Jeffie Cheryl BRAVO, MD Primary Care Provider 09/18/2023  Perla Lye, MD Cardiology 06/12/2023; preop APP call 01/18/2024   ALLERGIES: No Known Allergies  CURRENT HOME MEDICATIONS: No current facility-administered medications for this encounter.    ASHWAGANDHA PO   aspirin 81 MG tablet   Ca Carbonate-Mag Hydroxide (ROLAIDS PO)   flecainide  (TAMBOCOR ) 50 MG tablet   Krill Oil 350 MG CAPS   meloxicam (MOBIC) 7.5 MG tablet   metoprolol  succinate (TOPROL -XL) 25 MG 24 hr tablet   metoprolol  tartrate (LOPRESSOR ) 25 MG tablet   NON FORMULARY   simvastatin  (ZOCOR ) 80 MG tablet   tamsulosin  (FLOMAX ) 0.4 MG CAPS capsule   Turmeric (QC TUMERIC COMPLEX) 500 MG CAPS   HISTORY: Past Medical History:  Diagnosis Date   Arthritis    Benign localized hyperplasia of prostate with urinary obstruction 2016   Chest pain  a. 08/2013 in the setting of atrial fibrillation-->MV EF 70%, no ischemia or infarct.   Complication of anesthesia     a.) PONV   COVID-19 2022   Diastolic dysfunction    a. 10/2019 Echo: EF 60-65%, mild LVH, grade II diastolic dysfunction.  Normal RV function.  Moderately dilated LA.   ED (erectile dysfunction) of organic origin 2013   HTN (hypertension)    Hyperlipidemia    Kidney stones    Long term (current) use of aspirin    Male hypogonadism 2013   Morbid obesity (HCC)    OSA on CPAP    Paroxysmal A-fib (HCC) 08/2013   a.) Dx'd  08/2013; a.) CHA2DS2VASc = 3 (age, HTN, T2DM) as of 01/18/2024; b.) rate/rhythm maintained on oral flecainide  + metoprolol ; refuses OAC   PONV (postoperative nausea and vomiting)    T2DM (type 2 diabetes mellitus) (HCC)    Past Surgical History:  Procedure Laterality Date   COLONOSCOPY     EXTRACORPOREAL SHOCK WAVE LITHOTRIPSY Left 08/19/2020   Procedure: EXTRACORPOREAL SHOCK WAVE LITHOTRIPSY (ESWL);  Surgeon: Kassie Ozell SAUNDERS, MD;  Location: [facility] ORS;  Service: Urology;  Laterality: Left;   EXTRACORPOREAL SHOCK WAVE LITHOTRIPSY Left 09/15/2021   Procedure: EXTRACORPOREAL SHOCK WAVE LITHOTRIPSY (ESWL);  Surgeon: Kassie Ozell SAUNDERS, MD;  Location: [facility] ORS;  Service: Urology;  Laterality: Left;   LITHOTRIPSY     x 8   UMBILICAL HERNIA REPAIR N/A 11/10/2020   Procedure: HERNIA REPAIR UMBILICAL ADULT;  Surgeon: [name], MD;  Location: [facility] ORS;  Service: General;  Laterality: N/A;  open umbilical hernia repair   Family History  Problem Relation Age of Onset   Hypertension Mother    Heart attack Father 22   Social History   Tobacco Use   Smoking status: Never   Smokeless tobacco: Never  Substance Use Topics   Alcohol use: No   LABS:  Preadmission on 01/21/2024  Component Date Value Ref Range Status   Hgb A1c MFr Bld 01/18/2024 8.2 (H)  4.8 - 5.6 % Final   Comment: (NOTE) Diagnosis of Diabetes The following HbA1c ranges recommended by the American Diabetes Association (ADA) may be used as an aid in the diagnosis of diabetes mellitus.  Hemoglobin              Suggested A1C NGSP%              Diagnosis  <5.7                   Non Diabetic  5.7-6.4                Pre-Diabetic  >6.4                   Diabetic  <7.0                   Glycemic control for                       adults with diabetes.     Mean Plasma Glucose 01/18/2024 188.64  mg/dL Final   Performed at [facility], [address], [place], [place] [address]  Hospital Outpatient Visit on 01/18/2024  Component Date Value Ref Range Status   WBC 01/18/2024 5.9  4.0 - 10.5 K/uL Final   RBC 01/18/2024 4.47  4.22 - 5.81 MIL/uL Final   Hemoglobin 01/18/2024 14.4  13.0 - 17.0 g/dL Final   HCT 90/73/7974  40.5  39.0 - 52.0 % Final   MCV 01/18/2024 90.6  80.0 - 100.0 fL Final   MCH 01/18/2024 32.2  26.0 - 34.0 pg Final   MCHC 01/18/2024 35.6  30.0 - 36.0 g/dL Final   RDW 90/73/7974 12.3  11.5 - 15.5 % Final   Platelets 01/18/2024 153  150 - 400 K/uL Final   nRBC 01/18/2024 0.0  0.0 - 0.2 % Final   Performed at [facility], [address] [place], [place] [address]   Sodium 01/18/2024 139  135 - 145 mmol/L Final   Potassium 01/18/2024 4.2  3.5 - 5.1 mmol/L Final   Chloride 01/18/2024 101  98 - 111 mmol/L Final   CO2 01/18/2024 25  22 - 32 mmol/L Final   Glucose, Bld 01/18/2024 278 (H)  70 - 99 mg/dL Final   Glucose reference range applies only to samples taken after fasting for at least 8 hours.   BUN 01/18/2024 12  8 - 23 mg/dL Final   Creatinine, Ser 01/18/2024 0.56 (L)  0.61 - 1.24 mg/dL Final   Calcium 90/73/7974 8.9  8.9 - 10.3 mg/dL Final   GFR, Estimated 01/18/2024 >60  >60 mL/min Final   Comment: (NOTE) Calculated using the CKD-EPI Creatinine Equation (2021)    Anion gap 01/18/2024 13  5 - 15 Final   Performed at [facility], [address] [place], [place] [address]  Hospital Outpatient Visit on 01/15/2024  Component Date Value Ref Range Status   Color, Urine 01/15/2024 YELLOW  YELLOW Final   APPearance 01/15/2024 HAZY (A)  CLEAR  Final   Specific Gravity, Urine 01/15/2024 1.020  1.005 - 1.030 Final   pH 01/15/2024 5.5  5.0 - 8.0 Final   Glucose, UA 01/15/2024 >=500 (A)  NEGATIVE mg/dL Final   Hgb urine dipstick 01/15/2024 LARGE (A)  NEGATIVE Final   Bilirubin Urine 01/15/2024 NEGATIVE  NEGATIVE Final   Ketones, ur 01/15/2024 TRACE (A)  NEGATIVE mg/dL Final   Protein, ur 90/76/7974 30 (A)  NEGATIVE mg/dL Final   Nitrite 90/76/7974 NEGATIVE  NEGATIVE Final   Leukocytes,Ua 01/15/2024 NEGATIVE  NEGATIVE Final   Squamous Epithelial / HPF 01/15/2024 0-5  0 - 5 /HPF Final   WBC, UA 01/15/2024 0-5  0 - 5 WBC/hpf Final   RBC / HPF 01/15/2024 >50  0 - 5 RBC/hpf Final   Bacteria, UA 01/15/2024 FEW (A)  NONE SEEN Final   Performed at [facility], [address], [place], [place] [address]   Specimen Description 01/15/2024    Final                   Value:URINE, RANDOM Performed at [facility], [address], [place], [place] [address]    Special Requests 01/15/2024    Final                   Value:NONE Performed at [facility], [address] [place], [place] [address]    Culture 01/15/2024    Final                   Value:NO GROWTH Performed at [facility], [address], [place], [place] [address]    Report Status 01/15/2024 01/16/2024 FINAL   Final    ECG: Date: 01/18/2024 Time ECG obtained: 0813 AM Rate: 59 bpm Rhythm: sinus bradycardia Axis (leads I and aVF): right Intervals: PR 188 ms. QRS 96 ms. QTc 411 ms.  ST segment and T wave changes: No evidence of acute T wave abnormalities or significant ST segment elevation or depression.  Evidence of a possible, age undetermined, prior infarct:  No Comparison: Similar to previous tracing obtained on 06/12/2023   IMAGING / PROCEDURES: MYOCARDIAL PERFUSION IMAGING STUDY (LEXISCAN) performed on 03/22/2021 There is a small in size, mild in severity, fixed apical lateral and apical inferior defect that most likely  represents artifact but cannot exclude scar. There is noes evidence of significant ischemia. No significant coronary artery calcification is observed. Please note sensitivity of the study is degraded by failure to reach 85% MPHR by 1 bpm. Low risk, probably normal exercise myocardial perfusion stress test.  TRANSTHORACIC ECHOCARDIOGRAM performed on 11/07/2019 Left ventricular ejection fraction, by estimation, is 60 to 65%. The left ventricle has normal function. The left ventricle has no regional wall motion abnormalities. There is mild left ventricular hypertrophy. Left ventricular diastolic parameters are consistent with Grade II diastolic dysfunction (pseudonormalization).  Right ventricular systolic function is normal. The right ventricular size is normal.  Left atrial size was moderately dilated.   IMPRESSION AND PLAN: Marcus Bryant has been referred for pre-anesthesia review and clearance prior to him undergoing the planned anesthetic and procedural courses. Available labs, pertinent testing, and imaging results were personally reviewed by me in preparation for upcoming operative/procedural course. [facility] medical record has been updated following extensive record review and patient interview with PAT staff.   This patient has been appropriately cleared by cardiology with an overall ACCEPTABLE risk of patient experiencing significant perioperative cardiovascular complications. Based on clinical review performed today (01/18/24), barring any significant acute changes in the patient's overall condition, it is anticipated that he will be able to proceed with the planned surgical intervention. Any acute changes in clinical condition may necessitate his procedure being postponed and/or cancelled. Patient will meet with anesthesia team (MD and/or CRNA) on the day of his procedure for preoperative evaluation/assessment. Questions regarding anesthetic course will be fielded at that time.    Pre-surgical instructions were reviewed with the patient during his PAT appointment, and questions were fielded to satisfaction by PAT clinical staff. He has been instructed on which medications that he will need to hold prior to surgery, as well as the ones that have been deemed safe/appropriate to take on the day of his procedure. As part of the general education provided by PAT, patient made aware both verbally and in writing, that he would need to abstain from the use of any illegal substances during his perioperative course. He was advised that failure to follow the provided instructions could necessitate case cancellation or result in serious perioperative complications up to and including death. Patient encouraged to contact PAT and/or his surgeon's office to discuss any questions or concerns that may arise prior to surgery; verbalized understanding.   Dorise Pereyra, MSN, APRN, FNP-C, CEN [facility]  Perioperative Services Nurse Practitioner Phone: [phone] Fax: [phone] 01/18/24 2:46 PM  NOTE: This note has been prepared using Dragon dictation software. Despite my best ability to proofread, there is always the potential that unintentional transcriptional errors may still occur from this process.

## 2024-01-18 NOTE — Progress Notes (Signed)
 Virtual Visit via Telephone Note   Because of Marcus Bryant co-morbid illnesses, he is at least at moderate risk for complications without adequate follow up.  This format is felt to be most appropriate for this patient at this time.  Due to technical limitations with video connection (technology), today's appointment will be conducted as an audio only telehealth visit, and Marcus Bryant verbally agreed to proceed in this manner.   All issues noted in this document were discussed and addressed.  No physical exam could be performed with this format.  Evaluation Performed:  Preoperative cardiovascular risk assessment _____________   Date:  01/18/2024   Patient ID:  Marcus Bryant, Marcus Bryant 01-04-1956, MRN [id number] Patient Location:  Home Provider location:   Office  Primary Care Provider:  Jeffie Cheryl BRAVO, Marcus Bryant Primary Cardiologist:  Evalene Lunger, Marcus Bryant  Chief Complaint / Patient Profile   68 y.o. y/o male with a h/o chest pains paroxysmal atrial fibrillation, hyperlipidemia, obstructive sleep apnea, T2DM, nephrolithiasis who is pending left ureteroscopy with laser lithotripsy and stent placement on 01/21/2024 by [facility] and presents today for telephonic preoperative cardiovascular risk assessment.  History of Present Illness    Marcus Bryant is a 68 y.o. male who presents via audio/video conferencing for a telehealth visit today.  Pt was last seen in cardiology clinic on 06/12/2023 by Dr. Gollan.  At that time Marcus Bryant was doing well.  He reported rare episodes of A-fib, he declined starting anticoagulation at that time.  He was to follow-up in 1 year.  The patient is now pending procedure as outlined above. Since his last visit, he denies chest pain, shortness of breath, lower extremity edema, fatigue, palpitations, melena, hemoptysis, diaphoresis, weakness, presyncope, syncope, orthopnea, and PND.  Today patient is doing well overall.  He is without acute  cardiovascular concerns today.  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  He is without any exertional symptoms.  He denies any symptoms concerning for recurrent atrial fibrillation.  He does monitor his heart rhythm at home on his Apple watch.  Does have history of obstructive sleep apnea currently not on CPAP therapy.  Overall no symptoms to suggest active angina.  He can complete greater than 4 METS.  Past Medical History    Past Medical History:  Diagnosis Date   Arthritis    Chest pain    a. 08/2013 in the setting of atrial fibrillation-->MV EF 70%, no ischemia or infarct.   Complication of anesthesia    PONV   COVID-19 2022   Diastolic dysfunction    a. 10/2019 Echo: EF 60-65%, mild LVH, grade II diastolic dysfunction.  Normal RV function.  Moderately dilated LA.   History of kidney stones    Hyperlipidemia    Kidney stones    Left ureteral stone    Morbid obesity (HCC)    Paroxysmal A-fib (HCC)    a. Dx 08/2013 - not previously on OAC; b. CHA2DS2VASc = 1; c. 09/2019 Flecainide  100mg  prn added.   Sleep apnea    Past Surgical History:  Procedure Laterality Date   COLONOSCOPY     EXTRACORPOREAL SHOCK WAVE LITHOTRIPSY Left 08/19/2020   Procedure: EXTRACORPOREAL SHOCK WAVE LITHOTRIPSY (ESWL);  Surgeon: Kassie Ozell SAUNDERS, Marcus Bryant;  Location: [facility] ORS;  Service: Urology;  Laterality: Left;   EXTRACORPOREAL SHOCK WAVE LITHOTRIPSY Left 09/15/2021   Procedure: EXTRACORPOREAL SHOCK WAVE LITHOTRIPSY (ESWL);  Surgeon: Kassie Ozell SAUNDERS, Marcus Bryant;  Location: [facility] ORS;  Service: Urology;  Laterality: Left;   LITHOTRIPSY     x 8   UMBILICAL HERNIA REPAIR N/A 11/10/2020   Procedure: HERNIA REPAIR UMBILICAL ADULT;  Surgeon: [name], Marcus Bryant;  Location: [facility] ORS;  Service: General;  Laterality: N/A;  open umbilical hernia repair    Allergies  No Known Allergies  Home Medications    Prior to Admission medications    Medication Sig Start Date End Date Taking? Authorizing Provider  ASHWAGANDHA PO Take 2,100 mg by mouth daily.    Provider, Historical, Marcus Bryant  aspirin 81 MG tablet Take 81 mg by mouth at bedtime.    Provider, Historical, Marcus Bryant  Ca Carbonate-Mag Hydroxide (ROLAIDS PO) Take 1-2 tablets by mouth daily as needed (heartburn).    Provider, Historical, Marcus Bryant  flecainide  (TAMBOCOR ) 50 MG tablet Take 1 tablet (50 mg total) by mouth 2 (two) times daily. 06/12/23   Gollan, Timothy J, Marcus Bryant  Krill Oil 350 MG CAPS Take 350 mg by mouth daily.    Provider, Historical, Marcus Bryant  meloxicam (MOBIC) 7.5 MG tablet Take 7.5 mg by mouth daily.    Provider, Historical, Marcus Bryant  metoprolol  succinate (TOPROL -XL) 25 MG 24 hr tablet TAKE 1 TABLET BY MOUTH EVERY DAY WITH OR IMMEDIATELY FOLLOWING A MEAL 06/12/23   Gollan, Timothy J, Marcus Bryant  metoprolol  tartrate (LOPRESSOR ) 25 MG tablet Take 1 tablet (25 mg total) by mouth 2 (two) times daily as needed. 06/12/23   Gollan, Timothy J, Marcus Bryant  NON FORMULARY Take 2,250 mg by mouth daily.  Nitric Oxide Booster,    Provider, Historical, Marcus Bryant  simvastatin  (ZOCOR ) 80 MG tablet Take 0.5 tablets (40 mg total) by mouth daily. 06/12/23   Gollan, Timothy J, Marcus Bryant  tamsulosin  (FLOMAX ) 0.4 MG CAPS capsule Take 0.4 mg by mouth daily.    Provider, Historical, Marcus Bryant  Turmeric (QC TUMERIC COMPLEX) 500 MG CAPS Take 500 mg by mouth daily.    Provider, Historical, Marcus Bryant    Physical Exam    Vital Signs:  Marcus Bryant does not have vital signs available for review today.  Given telephonic nature of communication, physical exam is limited. AAOx3. NAD. Normal affect.  Speech and respirations are unlabored.  Accessory Clinical Findings    None  Assessment & Plan    1.  Preoperative Cardiovascular Risk Assessment: According to the Revised Cardiac Risk Index (RCRI), his Perioperative Risk of Major Cardiac Event is (%): 0.4. His Functional Capacity in METs is: 5.07 according to the Duke Activity Status Index (DASI).  Therefore, based  on ACC/AHA guidelines, patient would be at acceptable risk for the planned procedure without further cardiovascular testing.  The patient was advised that if he develops new symptoms prior to surgery to contact our office to arrange for a follow-up visit, and he verbalized understanding.  A copy of this note will be routed to requesting surgeon.  Time:   Today, I have spent 10 minutes with the patient with telehealth technology discussing medical history, symptoms, and management plan.     Lum Marcus Louis, NP  01/18/2024, 10:03 AM

## 2024-01-20 MED ORDER — CHLORHEXIDINE GLUCONATE 0.12 % MT SOLN: 15.0000 mL | Freq: Once | OROMUCOSAL | Status: AC

## 2024-01-20 MED ORDER — ORAL CARE MOUTH RINSE: 15.0000 mL | Freq: Once | OROMUCOSAL | Status: AC

## 2024-01-20 MED ORDER — LACTATED RINGERS IV SOLN: INTRAVENOUS | Status: AC

## 2024-01-20 MED ORDER — CEFAZOLIN SODIUM-DEXTROSE 2-4 GM/100ML-% IV SOLN: 2.0000 g | INTRAVENOUS | Status: AC

## 2024-01-21 ENCOUNTER — Ambulatory Visit: Admission: RE | Admit: 2024-01-21 | Source: Home / Self Care | Admitting: Urology

## 2024-01-21 ENCOUNTER — Other Ambulatory Visit: Payer: Self-pay

## 2024-01-21 ENCOUNTER — Encounter: Admission: RE | Payer: Self-pay | Source: Home / Self Care

## 2024-01-21 DIAGNOSIS — I4891 Unspecified atrial fibrillation: Secondary | ICD-10-CM

## 2024-01-21 DIAGNOSIS — N200 Calculus of kidney: Secondary | ICD-10-CM

## 2024-01-21 DIAGNOSIS — Z01810 Encounter for preprocedural cardiovascular examination: Secondary | ICD-10-CM

## 2024-01-21 DIAGNOSIS — Z01812 Encounter for preprocedural laboratory examination: Secondary | ICD-10-CM

## 2024-01-21 DIAGNOSIS — E785 Hyperlipidemia, unspecified: Secondary | ICD-10-CM

## 2024-01-21 DIAGNOSIS — R079 Chest pain, unspecified: Secondary | ICD-10-CM

## 2024-01-21 DIAGNOSIS — N201 Calculus of ureter: Secondary | ICD-10-CM

## 2024-01-21 DIAGNOSIS — E119 Type 2 diabetes mellitus without complications: Secondary | ICD-10-CM

## 2024-01-21 DIAGNOSIS — R310 Gross hematuria: Secondary | ICD-10-CM

## 2024-01-21 SURGERY — CYSTOSCOPY/URETEROSCOPY/HOLMIUM LASER/STENT PLACEMENT
Anesthesia: General | Laterality: Left

## 2024-01-29 ENCOUNTER — Ambulatory Visit
Admission: RE | Admit: 2024-01-29 | Discharge: 2024-01-29 | Disposition: A | Discharge status: Home / Self Care | Source: Ambulatory Visit | Attending: Urology | Admitting: Urology

## 2024-01-29 DIAGNOSIS — N200 Calculus of kidney: Secondary | ICD-10-CM | POA: Insufficient documentation

## 2024-01-29 DIAGNOSIS — N281 Cyst of kidney, acquired: Secondary | ICD-10-CM | POA: Diagnosis not present

## 2024-01-29 LAB — STONE ANALYSIS
Uric Acid Calculi: 100 %
Weight Calculi: 211 mg

## 2024-01-30 ENCOUNTER — Other Ambulatory Visit

## 2024-02-01 ENCOUNTER — Ambulatory Visit: Payer: Self-pay | Admitting: Urology

## 2024-02-08 NOTE — Progress Notes (Signed)
 Marcus Bryant                                          MRN: [id number]   02/08/2024   The VBCI Quality Team Specialist reviewed this patient medical record for the purposes of chart review for care gap closure. The following were reviewed: abstraction for care gap closure-glycemic status assessment.    VBCI Quality Team

## 2024-02-08 NOTE — Progress Notes (Deleted)
 PHILEMON RIEDESEL                                          MRN: [id number]   02/08/2024   The [facility] Quality Team Specialist reviewed this patient medical record for the purposes of chart review for care gap closure. The following were reviewed: chart review for care gap closure-controlling blood pressure.    [facility]BC[facility] Quality Team

## 2024-02-19 ENCOUNTER — Ambulatory Visit: Admitting: Urology

## 2024-02-19 VITALS — BP 128/75 | HR 60 | Wt 240.0 lb

## 2024-02-19 DIAGNOSIS — N529 Male erectile dysfunction, unspecified: Secondary | ICD-10-CM

## 2024-02-19 DIAGNOSIS — N3281 Overactive bladder: Secondary | ICD-10-CM | POA: Diagnosis not present

## 2024-02-19 DIAGNOSIS — Z125 Encounter for screening for malignant neoplasm of prostate: Secondary | ICD-10-CM | POA: Diagnosis not present

## 2024-02-19 DIAGNOSIS — N200 Calculus of kidney: Secondary | ICD-10-CM

## 2024-02-19 DIAGNOSIS — R399 Unspecified symptoms and signs involving the genitourinary system: Secondary | ICD-10-CM | POA: Diagnosis not present

## 2024-02-19 MED ORDER — OXYBUTYNIN CHLORIDE ER 10 MG PO TB24: 10.0000 mg | ORAL_TABLET | Freq: Every day | ORAL | 11 refills | Status: DC

## 2024-02-19 NOTE — Progress Notes (Signed)
   02/19/2024 12:11 PM   Marcus Bryant [birth year] [id number]  Reason for visit: Follow up nephrolithiasis, PSA screening, OAB, ED  History: Long history of recurrent stone disease, followed by multiple providers previously, 15+ ureteroscopy procedures Passed at least five 5 to 10 mm stones over the last few weeks, follow-up renal ultrasound showed no hydronephrosis, stone analysis showed uric acid Has had bothersome side effects of dry mouth from Flomax  PSA normal and stable at 3.8 01 March 2023  Physical Exam: BP 128/75 (BP Location: Left Arm, Patient Position: Sitting, Cuff Size: Large)   Pulse 60   Wt 240 lb (108.9 kg)   SpO2 96%   BMI 32.55 kg/m   Imaging/labs: Personally reviewed and interpreted the renal ultrasound showing bilateral nephrolithiasis, but no evidence of hydronephrosis Stone analysis 100% uric acid  Today: Denies any flank pain or gross hematuria Persistent urinary symptoms despite Flomax  with urgency, frequency, nocturia, has never tried OAB medications previously ED refractory to 20 mg Cialis, not his main priority right now  Plan:   Nephrolithiasis: Has not tolerated potassium citrate in the past, with uric acid stones I recommended half teaspoon baking soda mixed with 8 ounces of water twice daily for urinary alkalinization.  Will check UA at follow-up, consider 24-hour urine in the future, he has deferred previously BPH/OAB: Bothersome side effects from Flomax , recommended trial of oxybutynin 10 mg XL daily, check IPSS and PVR follow-up PSA screening: Normal and stable, continue yearly screening ED: Not currently interested in other treatment options, failed PDE 5 inhibitors Trial of oxybutynin for OAB and half teaspoon baking soda twice daily to prevent uric acid stones, RTC 6 weeks UA(to check urine pH), IPSS, PVR   Marcus JAYSON Burnet, MD  [facility] [address] [place], [place] [address] [phone]

## 2024-02-19 NOTE — Patient Instructions (Signed)
 Mix half a teaspoon baking soda with 8 ounces of water twice daily to help prevent kidney stones Decrease the amount of animal protein in your diet Continue to drink plenty of fluids

## 2024-04-01 ENCOUNTER — Ambulatory Visit: Admitting: Urology

## 2024-04-01 ENCOUNTER — Ambulatory Visit: Admitting: Physician Assistant

## 2024-04-01 DIAGNOSIS — N3281 Overactive bladder: Secondary | ICD-10-CM

## 2024-04-04 ENCOUNTER — Ambulatory Visit: Admitting: Physician Assistant

## 2024-04-07 ENCOUNTER — Encounter: Payer: Self-pay | Admitting: Physician Assistant

## 2024-04-07 ENCOUNTER — Ambulatory Visit: Admitting: Physician Assistant

## 2024-04-07 VITALS — BP 126/68 | HR 57 | Ht 72.0 in | Wt 235.7 lb

## 2024-04-07 DIAGNOSIS — N200 Calculus of kidney: Secondary | ICD-10-CM

## 2024-04-07 DIAGNOSIS — N3281 Overactive bladder: Secondary | ICD-10-CM

## 2024-04-07 LAB — BLADDER SCAN AMB NON-IMAGING

## 2024-04-07 MED ORDER — SOLIFENACIN SUCCINATE 10 MG PO TABS: 10.0000 mg | ORAL_TABLET | Freq: Every day | ORAL | 11 refills | Status: AC

## 2024-04-07 NOTE — Progress Notes (Unsigned)
 04/07/2024 11:14 AM   Marcus Bryant [birth year] [id number]  CC: Chief Complaint  Patient presents with   Nephrolithiasis   HPI: Marcus Bryant is a 68 y.o. male with PMH uncontrolled diabetes, recurrent uric acid urolithiasis, BPH/OAB, OSA not on CPAP, and ED who failed PDE 5 inhibitors who presents today for follow-up on p.o. baking soda and oxybutynin  XL 10 mg daily.   Today he reports no change in his voiding symptoms on oxybutynin .  He has chronic dry mouth at baseline.  His most recent A1c was 9.6 6 months ago.  Marcus Bryant started him on pioglitazone, but Marcus Bryant stopped this when it caused him to regain some weight.  He is currently only managing his diabetes through diet, and admits that his diet is not the best.  He does try to cut back on soda and tea.  From the stone perspective, he passed several additional stones since his last visit with Marcus Bryant including three 5 mm stones, one 2 mm stone, and one 3 mm stone.  He remains on p.o. baking soda twice daily, and adds cinnamon to help with his blood sugar.  He previously was on allopurinol per Marcus Bryant and was stone free for 2 years.  He wonders if he should go back on this.  In-office UA today positive for pH 6.5, 3+ glucose, trace protein, trace intact blood, and 2.0 EU/DL urobilinogen; urine microscopy with 3-10 RBCs/HPF.   IPSS 23/unhappy as below. PVR 52mL.    IPSS     Row Name 04/07/24 1100         International Prostate Symptom Score   How often have you had the sensation of not emptying your bladder? More than half the time     How often have you had to urinate less than every two hours? Almost always     How often have you found you stopped and started again several times when you urinated? Almost always     How often have you found it difficult to postpone urination? Almost always     How often have you had a weak urinary stream? Less than half the time     How often have you had to strain to  start urination? Not at All     How many times did you typically get up at night to urinate? 2 Times     Total IPSS Score 23       Quality of Life due to urinary symptoms   If you were to spend the rest of your life with your urinary condition just the way it is now how would you feel about that? Unhappy         PMH: Past Medical History:  Diagnosis Date   Arthritis    Benign localized hyperplasia of prostate with urinary obstruction 2016   Chest pain    a. 08/2013 in the setting of atrial fibrillation-->MV EF 70%, no ischemia or infarct.   Complication of anesthesia    a.) PONV   COVID-19 2022   Diastolic dysfunction    a. 10/2019 Echo: EF 60-65%, mild LVH, grade II diastolic dysfunction.  Normal RV function.  Moderately dilated LA.   ED (erectile dysfunction) of organic origin 2013   HTN (hypertension)    Hyperlipidemia    Kidney stones    Long term (current) use of aspirin    Male hypogonadism 2013   Morbid obesity (HCC)    OSA on CPAP  Paroxysmal A-fib (HCC) 08/2013   a.) Dx'd  08/2013; a.) CHA2DS2VASc = 3 (age, HTN, T2DM) as of 01/18/2024; b.) rate/rhythm maintained on oral flecainide  + metoprolol ; refuses OAC   PONV (postoperative nausea and vomiting)    T2DM (type 2 diabetes mellitus) [facility])     Surgical History: Past Surgical History:  Procedure Laterality Date   COLONOSCOPY     EXTRACORPOREAL SHOCK WAVE LITHOTRIPSY Left 08/19/2020   Procedure: EXTRACORPOREAL SHOCK WAVE LITHOTRIPSY (ESWL);  Surgeon: Marcus Ozell SAUNDERS, MD;  Location: [facility] ORS;  Service: Urology;  Laterality: Left;   EXTRACORPOREAL SHOCK WAVE LITHOTRIPSY Left 09/15/2021   Procedure: EXTRACORPOREAL SHOCK WAVE LITHOTRIPSY (ESWL);  Surgeon: Marcus Ozell SAUNDERS, MD;  Location: [facility] ORS;  Service: Urology;  Laterality: Left;   LITHOTRIPSY     x 8   UMBILICAL HERNIA REPAIR N/A 11/10/2020   Procedure: HERNIA REPAIR UMBILICAL ADULT;  Surgeon: Marcus Reyes ORN, MD;  Location: [facility] ORS;  Service: General;   Laterality: N/A;  open umbilical hernia repair    Home Medications:  Allergies as of 04/07/2024   No Known Allergies      Medication List        Accurate as of April 07, 2024 11:14 AM. If you have any questions, ask your nurse or doctor.          ASHWAGANDHA PO Take 2,100 mg by mouth daily.   aspirin 81 MG tablet Take 81 mg by mouth at bedtime.   flecainide  50 MG tablet Commonly known as: TAMBOCOR  Take 1 tablet (50 mg total) by mouth 2 (two) times daily.   Krill Oil 350 MG Caps Take 350 mg by mouth daily.   meloxicam 7.5 MG tablet Commonly known as: MOBIC Take 7.5 mg by mouth daily.   metoprolol  succinate 25 MG 24 hr tablet Commonly known as: TOPROL -XL TAKE 1 TABLET BY MOUTH EVERY DAY WITH OR IMMEDIATELY FOLLOWING A MEAL   metoprolol  tartrate 25 MG tablet Commonly known as: LOPRESSOR  Take 1 tablet (25 mg total) by mouth 2 (two) times daily as needed.   NON FORMULARY Take 2,250 mg by mouth daily.  Nitric Oxide Booster,   oxybutynin  10 MG 24 hr tablet Commonly known as: DITROPAN -XL Take 1 tablet (10 mg total) by mouth daily.   QC Tumeric Complex 500 MG Caps Generic drug: Turmeric Take 500 mg by mouth daily.   ROLAIDS PO Take 1-2 tablets by mouth daily as needed (heartburn).   simvastatin  80 MG tablet Commonly known as: ZOCOR  Take 0.5 tablets (40 mg total) by mouth daily.        Allergies:  Allergies[1]  Family History: Family History  Problem Relation Age of Onset   Hypertension Mother    Heart attack Father 67    Social History:   reports that he has never smoked. He has never used smokeless tobacco. He reports that he does not drink alcohol and does not use drugs.  Physical Exam: There were no vitals taken for this visit.  Constitutional:  Alert and oriented, no acute distress, nontoxic appearing HEENT: [place], AT Cardiovascular: No clubbing, cyanosis, or edema Respiratory: Normal respiratory effort, no increased work of  breathing Skin: No rashes, bruises or suspicious lesions Neurologic: Grossly intact, no focal deficits, moving all 4 extremities Psychiatric: Normal mood and affect  Laboratory Data: Results for orders placed or performed in visit on 04/07/24  Microscopic Examination   Collection Time: 04/07/24 11:11 AM   Urine  Result Value Ref Range   WBC, UA 0-5 0 -  5 /hpf   RBC, Urine 3-10 (A) 0 - 2 /hpf   Epithelial Cells (non renal) 0-10 0 - 10 /hpf   Bacteria, UA Few None seen/Few  Urinalysis, Complete   Collection Time: 04/07/24 11:11 AM  Result Value Ref Range   Specific Gravity, UA 1.020 1.005 - 1.030   pH, UA 6.5 5.0 - 7.5   Color, UA Yellow Yellow   Appearance Ur Clear Clear   Leukocytes,UA Negative Negative   Protein,UA Trace Negative/Trace   Glucose, UA 3+ (A) Negative   Ketones, UA Negative Negative   RBC, UA Trace (A) Negative   Bilirubin, UA Negative Negative   Urobilinogen, Ur 2.0 (H) 0.2 - 1.0 mg/dL   Nitrite, UA Negative Negative   Microscopic Examination See below:   BLADDER SCAN AMB NON-IMAGING   Collection Time: 04/07/24 11:25 AM  Result Value Ref Range   Scan Result 52ml    Assessment & Plan:   1. Nephrolithiasis (Primary) Urine pH has risen to 6.5.  I encouraged him to continue baking soda therapy.  We discussed that there is not great data around the use of allopurinol in preventing uric acid stones, though anecdotally he seems to have done well with this.  I will pass along his question to Marcus Bryant per patient request. - Urinalysis, Complete  2. OAB (overactive bladder) No change on oxybutynin  XL 10 mg.  I explained that I strongly suspect his uncontrolled diabetes and glucosuria is contributing to his voiding symptoms and I highly encouraged him to prioritize glycemic control.  That said, we will try Vesicare  as alternative agent to see if this makes a difference for him. - BLADDER SCAN AMB NON-IMAGING - solifenacin  (VESICARE ) 10 MG tablet; Take 1 tablet (10  mg total) by mouth daily.  Dispense: 30 tablet; Refill: 11   Return for Keep follow-up as scheduled.  Lucie Hones, PA-C  [facility] [place] [address] [place], [place] [address] [phone]      [1] No Known Allergies

## 2024-04-08 LAB — URINALYSIS, COMPLETE
Bilirubin, UA: NEGATIVE
Ketones, UA: NEGATIVE
Leukocytes,UA: NEGATIVE
Nitrite, UA: NEGATIVE
Specific Gravity, UA: 1.02 (ref 1.005–1.030)
Urobilinogen, Ur: 2 mg/dL — ABNORMAL HIGH (ref 0.2–1.0)
pH, UA: 6.5 (ref 5.0–7.5)

## 2024-04-08 LAB — MICROSCOPIC EXAMINATION

## 2024-04-14 ENCOUNTER — Telehealth: Payer: Self-pay | Admitting: Physician Assistant

## 2024-04-14 NOTE — Telephone Encounter (Signed)
 Patient called requesting a call back from Sam or CMA regarding questions he has from last weeks appointment on 04/07/24. Please advise.

## 2024-04-15 ENCOUNTER — Encounter: Payer: Self-pay | Admitting: Physician Assistant

## 2024-04-15 NOTE — Telephone Encounter (Signed)
 I called pt to ask him what concerns he wanted to go after his appt last week. Pt voiced frustrations that his family Hx of his mother and father are not correct. I took down the correct info for him and let him know that we would up date that for him. Pt had complaints about his myChart and wanted it deleted. I explained I could deactivate it but he would still have a medical record. Pt voiced some concerns but I explained the reasoning behind us  having medical records on him. Pt voiced understanding after my explanation.

## 2024-04-28 ENCOUNTER — Other Ambulatory Visit: Payer: Self-pay | Admitting: Cardiovascular Disease

## 2024-04-29 ENCOUNTER — Ambulatory Visit: Admitting: Urology

## 2024-04-29 VITALS — BP 119/63 | HR 60 | Ht 72.0 in | Wt 235.7 lb

## 2024-04-29 DIAGNOSIS — N3281 Overactive bladder: Secondary | ICD-10-CM

## 2024-04-29 DIAGNOSIS — Z125 Encounter for screening for malignant neoplasm of prostate: Secondary | ICD-10-CM | POA: Diagnosis not present

## 2024-04-29 DIAGNOSIS — R399 Unspecified symptoms and signs involving the genitourinary system: Secondary | ICD-10-CM

## 2024-04-29 DIAGNOSIS — N200 Calculus of kidney: Secondary | ICD-10-CM | POA: Diagnosis not present

## 2024-04-29 LAB — URINALYSIS, COMPLETE
Bilirubin, UA: NEGATIVE
Ketones, UA: NEGATIVE
Leukocytes,UA: NEGATIVE
Nitrite, UA: NEGATIVE
Specific Gravity, UA: 1.02 (ref 1.005–1.030)
Urobilinogen, Ur: 2 mg/dL — ABNORMAL HIGH (ref 0.2–1.0)
pH, UA: 7 (ref 5.0–7.5)

## 2024-04-29 LAB — MICROSCOPIC EXAMINATION

## 2024-04-29 LAB — BLADDER SCAN AMB NON-IMAGING

## 2024-04-29 NOTE — Progress Notes (Signed)
" ° °  04/29/2024 2:23 PM   Marcus Bryant 05-03-1955 [id number]  Reason for visit: Follow up recurrent nephrolithiasis, PSA screening, OAB/LUTS, ED  History: Long history of recurrent stone disease, followed by multiple providers previously, 15+ ureteroscopy procedures.  Uric acid stones Severe overactive urinary symptoms with urgency, frequency, urge incontinence, nocturia 3-4 times overnight Comorbidities notable for obesity and poorly controlled diabetes Previously trialed on Flomax  with minimal to no improvement in urinary symptoms Currently on Vesicare  after no improvement on oxybutynin  ED refractory to medications, not his current priority Started on half teaspoon baking soda twice daily for uric acid stones October 2025  Physical Exam: BP 119/63 (BP Location: Left Arm, Patient Position: Sitting, Cuff Size: Large)   Pulse 60   Ht 6' (1.829 m)   Wt 235 lb 11.2 oz (106.9 kg)   SpO2 95%   BMI 31.97 kg/m   Imaging/labs: CT September 2025 at [facility] urology showed large distal ureteral stones with left hydronephrosis and bilateral nonobstructing stones but no other abnormalities.  He subsequently passed the ureteral stones and a follow-up renal ultrasound October 2025 showed no hydronephrosis PSA November 2024 3.87 Urine pH today 7  Today: Reports significant improvement in urinary symptoms on Vesicare , nocturia improved to 1 time overnight, still having some frequency and urgency during the day but incontinence improved.  PVR today normal at Denies any flank pain or dysuria  Plan:   OAB/LUTS: Improved significantly on Vesicare  10 mg daily.  Discussed avoiding bladder irritants and improve diabetes control.  Consider cystoscopy in the future if worsening symptoms Nephrolithiasis: Compliant with baking soda twice daily for urinary alkalinization, urine pH 7 today, consider CT if recurrent flank pain to evaluate stone burden, though should have some dissolution of current  nonobstructing stones with alkalinization PSA screening: Repeat PSA next year per guideline recommendations ED: Failed PDE 5 inhibitors, not his current priority Continue Vesicare , RTC 4 months for urinary symptom check, UA for pH check   Redell JAYSON Burnet, MD  [facility] [address], [address] [place], [place] [address] [phone]  "

## 2024-05-23 NOTE — Progress Notes (Signed)
 Marcus Bryant, RMA to Me  (Selected Message) TL   05/15/24 12:05 PM Result Note Patient notified- left message on vm and mailed labs per pt request   CCM 10 mins Dr Jeffie 3 mins   Hemoglobin A1C; Lipid Panel w/calc LDL; Comprehensive Metabolic Panel (CMP)

## 2024-05-30 ENCOUNTER — Ambulatory Visit

## 2024-05-30 VITALS — BP 142/78 | HR 6 | Ht 72.0 in | Wt 240.7 lb

## 2024-05-30 DIAGNOSIS — R310 Gross hematuria: Secondary | ICD-10-CM

## 2024-05-30 DIAGNOSIS — R339 Retention of urine, unspecified: Secondary | ICD-10-CM

## 2024-05-30 DIAGNOSIS — N3281 Overactive bladder: Secondary | ICD-10-CM

## 2024-05-30 LAB — URINALYSIS, COMPLETE

## 2024-05-30 LAB — MICROSCOPIC EXAMINATION
RBC, Urine: >30 /HPF — AB (ref 0–2)
WBC, UA: NONE SEEN /HPF (ref 0–5)

## 2024-05-30 LAB — BLADDER SCAN AMB NON-IMAGING

## 2024-05-30 MED ORDER — CEFTRIAXONE SODIUM 1 G IJ SOLR
1.0000 g | Freq: Once | INTRAMUSCULAR | Status: AC
  Administered 2024-05-30: 1 g via INTRAMUSCULAR

## 2024-05-30 NOTE — Patient Instructions (Signed)
 Please call central scheduling at [phone]

## 2024-05-30 NOTE — Progress Notes (Unsigned)
 Bladder Irrigation  Due to gross hematuri patient is present today for a bladder irrigation. Patient was cleaned and prepped in a sterile fashion. *** mL of saline/sterile water was instilled into the bladder with a 70mL To[place]y syringe through the catheter in place.  ***mL of urine return was cleared from the bladder with evacuation of ***ccs of clot material. Efflux cleared from *** to *** with the procedure and the catheter irrigated ***. Upon completion, the catheter was draining well and was reattached to the *** bag for drainage. Patient tolerated well.   Performed by: Marry Sara, PA-C and Clotilda Cornwall, PA-C  Additional notes/ Follow up: N/A

## 2024-05-30 NOTE — Progress Notes (Unsigned)
 "  05/30/2024 9:05 AM   Marcus Bryant 11/17/1955 [id number]  CC: Chief Complaint  Patient presents with   Hematuria    HPI: Marcus Bryant is a 69 y.o. male with PMH significant for uncontrolled diabetes, recurrent uric acid urolithiasis, BPH/OAB, OSA not on CPAP, and ED who failed PDE5 inhibitors who presents today for gross hematuria.   Today he reports 2-day history of gross hematuria. He states for the past week he has noticed some blood in his urine. He first noticed the blood when he was working outside needed to urinate. He proceeded to urinate in the snow and noticed his urine was red.   He reports that this morning around 2:00 am he felt the urge to urinate, but was unable to void. He was unable to void for 2 hours, so decided to take a hot shower. After getting out of the shower he states he had experienced urinary urgency and went to sit on the toilet. He then states large clots began to shoot out of his penis accompanied by urine. After passing clots he could urinate freely.   He denies abdominal pain, flank pain, fever, chills, nausea, or vomiting.   In-office UA today error due to color interference; urine microscopy with no WBCs/HPF, >30 RBCs/HPF, and few bacteria.   PVR 368 mL.   PMH: Past Medical History:  Diagnosis Date   Arthritis    Benign localized hyperplasia of prostate with urinary obstruction 2016   Chest pain    a. 08/2013 in the setting of atrial fibrillation-->MV EF 70%, no ischemia or infarct.   Complication of anesthesia    a.) PONV   COVID-19 2022   Diastolic dysfunction    a. 10/2019 Echo: EF 60-65%, mild LVH, grade II diastolic dysfunction.  Normal RV function.  Moderately dilated LA.   ED (erectile dysfunction) of organic origin 2013   HTN (hypertension)    Hyperlipidemia    Kidney stones    Long term (current) use of aspirin    Male hypogonadism 2013   Morbid obesity (HCC)    OSA on CPAP    Paroxysmal A-fib (HCC) 08/2013   a.) Dx'd   08/2013; a.) CHA2DS2VASc = 3 (age, HTN, T2DM) as of 01/18/2024; b.) rate/rhythm maintained on oral flecainide  + metoprolol ; refuses OAC   PONV (postoperative nausea and vomiting)    T2DM (type 2 diabetes mellitus) (HCC)     Surgical History: Past Surgical History:  Procedure Laterality Date   COLONOSCOPY     EXTRACORPOREAL SHOCK WAVE LITHOTRIPSY Left 08/19/2020   Procedure: EXTRACORPOREAL SHOCK WAVE LITHOTRIPSY (ESWL);  Surgeon: Kassie Ozell SAUNDERS, MD;  Location: [facility] ORS;  Service: Urology;  Laterality: Left;   EXTRACORPOREAL SHOCK WAVE LITHOTRIPSY Left 09/15/2021   Procedure: EXTRACORPOREAL SHOCK WAVE LITHOTRIPSY (ESWL);  Surgeon: Kassie Ozell SAUNDERS, MD;  Location: [facility] ORS;  Service: Urology;  Laterality: Left;   LITHOTRIPSY     x 8   UMBILICAL HERNIA REPAIR N/A 11/10/2020   Procedure: HERNIA REPAIR UMBILICAL ADULT;  Surgeon: [name], MD;  Location: [facility] ORS;  Service: General;  Laterality: N/A;  open umbilical hernia repair    Home Medications:  Allergies as of 05/30/2024   No Known Allergies      Medication List        Accurate as of May 30, 2024  9:05 AM. If you have any questions, ask your nurse or doctor.          ASHWAGANDHA PO Take 2,100 mg by  mouth daily.   aspirin 81 MG tablet Take 81 mg by mouth at bedtime.   flecainide  50 MG tablet Commonly known as: TAMBOCOR  TAKE 1 TABLET BY MOUTH TWICE A DAY   Krill Oil 350 MG Caps Take 350 mg by mouth daily.   meloxicam 7.5 MG tablet Commonly known as: MOBIC Take 7.5 mg by mouth daily.   metoprolol  succinate 25 MG 24 hr tablet Commonly known as: TOPROL -XL TAKE 1 TABLET BY MOUTH EVERY DAY WITH OR IMMEDIATELY FOLLOWING A MEAL   metoprolol  tartrate 25 MG tablet Commonly known as: LOPRESSOR  Take 1 tablet (25 mg total) by mouth 2 (two) times daily as needed.   NON FORMULARY Take 2,250 mg by mouth daily.  Nitric Oxide Booster,   QC Tumeric Complex 500 MG Caps Generic drug: Turmeric Take 500 mg by  mouth daily.   ROLAIDS PO Take 1-2 tablets by mouth daily as needed (heartburn).   Semaglutide(0.25 or 0.5MG /DOS) 2 MG/3ML Sopn Inject 0.25 mg into the skin once a week.   simvastatin  80 MG tablet Commonly known as: ZOCOR  Take 0.5 tablets (40 mg total) by mouth daily.   solifenacin  10 MG tablet Commonly known as: VESICARE  Take 1 tablet (10 mg total) by mouth daily.        Allergies:  Allergies[1]  Family History: Family History  Problem Relation Age of Onset   Cancer Father     Social History:   reports that he has never smoked. He has never used smokeless tobacco. He reports that he does not drink alcohol and does not use drugs.  Physical Exam: BP (!) 142/78 (BP Location: Left Arm, Patient Position: Sitting, Cuff Size: Large)   Pulse (!) 6   Ht 6' (1.829 m)   Wt 240 lb 11.2 oz (109.2 kg)   SpO2 97%   BMI 32.64 kg/m   Constitutional:  Alert and oriented, no acute distress, nontoxic appearing HEENT: , AT Cardiovascular: No clubbing, cyanosis, or edema Respiratory: Normal respiratory effort, no increased work of breathing GU: No CVA tenderness Skin: No rashes, bruises or suspicious lesions Neurologic: Grossly intact, no focal deficits, moving all 4 extremities Psychiatric: Normal mood and affect  Laboratory Data: Lab Results  Component Value Date   WBC 5.9 01/18/2024   HGB 14.4 01/18/2024   HCT 40.5 01/18/2024   MCV 90.6 01/18/2024   PLT 153 01/18/2024    Lab Results  Component Value Date   CREATININE 0.56 (L) 01/18/2024    CrCl cannot be calculated (Patient's most recent lab result is older than the maximum 21 days allowed.).  Results for orders placed or performed in visit on 05/30/24  Bladder Scan (Post Void Residual) in office   Collection Time: 05/30/24  8:41 AM  Result Value Ref Range   Scan Result     Pertinent Imaging: US  RENAL 01/29/24 IMPRESSION: 1. Numerous nonobstructing calculi in both kidneys, largest measuring 12 mm  in the right kidney and 10 mm in the left kidney. Apparent increase in stone burden since prior CT examination would be unusual in such a short interval and may be artifactual, related to echogenic renal sinus fat. 2. Resolved left-sided hydronephrosis.  Abdomen 1 view (KUB) 01/17/24 IMPRESSION: Bilateral nephrolithiasis.  Assessment & Plan:   Marcus Bryant is a 69 y.o. male with PMH significant for uncontrolled diabetes, recurrect uric acid urolithiasis, BPH/OAB, OSA not on CPAP, and ED who failed PDE5 inhibitors who presents today for gross hematuria. Seeing that the patient was passing large clots,  had a PVR of 368 mL, and was unable to urinate; bladder irrigation was performed in-clinic. Discussed leaving the foley catheter in placed vs removal. Patient preferred to have foley removed.  I discussed next steps of management for gross hematuria with the patient which includes CT urogram and cystoscopy. Patients understands plan and is agreeable.   1. Gross hematuria (Primary) - Urinalysis, Complete - CULTURE, URINE COMPREHENSIVE pending - PVR 368 mL - Bladder irrigation in office. 380 mL of urine drained from bladder prior to irrigation.  - CT urogram - Cystoscopy scheduled with [name] 07/02/24 - Rocephin  - Strict return precautions if patient is experiencing significant abdominal pain, significant blood loss and passage of large clots, or unable to urinate.    2. OAB (overactive bladder) - Bladder Scan (Post Void Residual) in office    Marry MALVA Sara, PA-C  [facility] [address] [place], [place] [address] [phone]      [1] No Known Allergies  "

## 2024-07-02 ENCOUNTER — Other Ambulatory Visit: Admitting: Urology

## 2024-08-27 ENCOUNTER — Ambulatory Visit: Admitting: Urology
